# Patient Record
Sex: Male | Born: 1945 | Race: White | Hispanic: No | Marital: Married | Smoking: Former smoker
Health system: Southern US, Community
[De-identification: ages and names within clinical notes are randomized; demographics above are authoritative.]

## PROBLEM LIST (undated history)

## (undated) DIAGNOSIS — D649 Anemia, unspecified: Secondary | ICD-10-CM

## (undated) DIAGNOSIS — R51 Headache: Secondary | ICD-10-CM

## (undated) DIAGNOSIS — M1990 Unspecified osteoarthritis, unspecified site: Secondary | ICD-10-CM

## (undated) DIAGNOSIS — I10 Essential (primary) hypertension: Secondary | ICD-10-CM

## (undated) HISTORY — PX: CYST REMOVAL HAND: SHX6279

## (undated) HISTORY — PX: CYST REMOVAL NECK: SHX6281

---

## 1969-03-02 HISTORY — PX: ORIF ORBITAL FRACTURE: SHX5312

## 2000-10-08 ENCOUNTER — Encounter: Admission: RE | Admit: 2000-10-08 | Discharge: 2000-10-08 | Payer: Self-pay | Admitting: Sports Medicine

## 2000-10-19 ENCOUNTER — Encounter: Admission: RE | Admit: 2000-10-19 | Discharge: 2000-10-19 | Payer: Self-pay | Admitting: Family Medicine

## 2004-10-09 ENCOUNTER — Ambulatory Visit: Payer: Self-pay | Admitting: Unknown Physician Specialty

## 2009-03-02 DIAGNOSIS — D649 Anemia, unspecified: Secondary | ICD-10-CM

## 2009-03-02 HISTORY — DX: Anemia, unspecified: D64.9

## 2009-06-26 ENCOUNTER — Ambulatory Visit: Payer: Self-pay | Admitting: Gastroenterology

## 2009-08-26 ENCOUNTER — Ambulatory Visit: Payer: Self-pay | Admitting: Family Medicine

## 2009-08-29 ENCOUNTER — Ambulatory Visit: Payer: Self-pay | Admitting: Family Medicine

## 2009-08-30 ENCOUNTER — Ambulatory Visit: Payer: Self-pay | Admitting: Internal Medicine

## 2009-09-19 ENCOUNTER — Ambulatory Visit: Payer: Self-pay | Admitting: Internal Medicine

## 2009-09-20 LAB — PSA

## 2009-09-20 LAB — CEA: CEA: 1 ng/mL

## 2009-09-30 ENCOUNTER — Ambulatory Visit: Payer: Self-pay | Admitting: Internal Medicine

## 2009-10-31 ENCOUNTER — Ambulatory Visit: Payer: Self-pay | Admitting: Internal Medicine

## 2009-12-20 ENCOUNTER — Ambulatory Visit: Payer: Self-pay | Admitting: Internal Medicine

## 2009-12-31 ENCOUNTER — Ambulatory Visit: Payer: Self-pay | Admitting: Internal Medicine

## 2010-02-13 ENCOUNTER — Ambulatory Visit: Payer: Self-pay | Admitting: Neurology

## 2010-02-13 ENCOUNTER — Ambulatory Visit: Payer: Self-pay | Admitting: Internal Medicine

## 2010-03-02 ENCOUNTER — Ambulatory Visit: Payer: Self-pay | Admitting: Internal Medicine

## 2010-04-17 ENCOUNTER — Ambulatory Visit: Payer: Self-pay | Admitting: Internal Medicine

## 2010-05-01 ENCOUNTER — Ambulatory Visit: Payer: Self-pay | Admitting: Internal Medicine

## 2010-08-21 ENCOUNTER — Ambulatory Visit: Payer: Self-pay | Admitting: Internal Medicine

## 2010-08-31 ENCOUNTER — Ambulatory Visit: Payer: Self-pay | Admitting: Internal Medicine

## 2010-09-14 IMAGING — US ABDOMEN ULTRASOUND LIMITED
1 series · 17 of 25 positions shown · non-contrast
Comparison: none

REASON FOR EXAM: hepatosplenomegaly has tear drop cells and bone lesions
COMMENTS:

[Series 1: abdomen ultrasound limited · 17 of 53 slices shown]
[im 1/53]
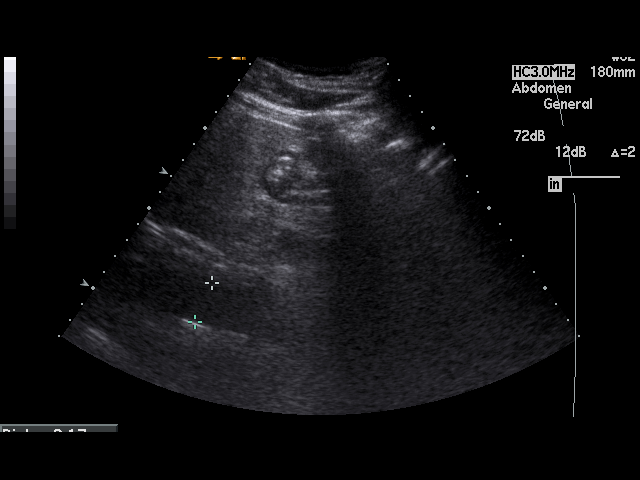
[im 5/53]
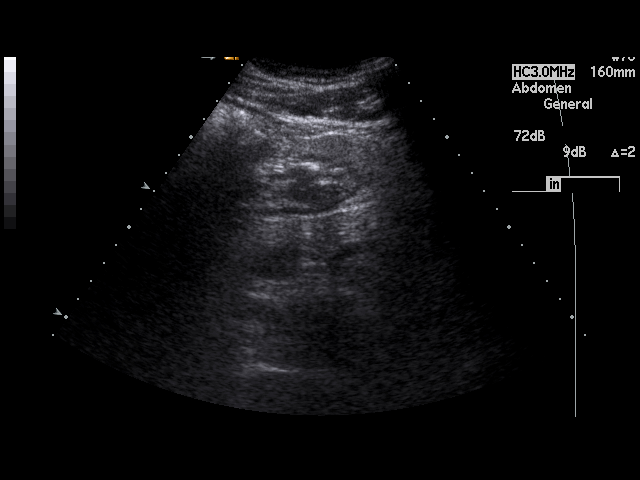
[im 7/53]
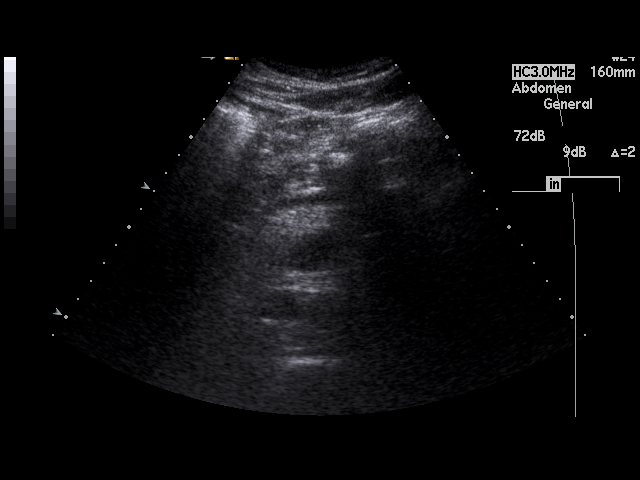
[im 11/53]
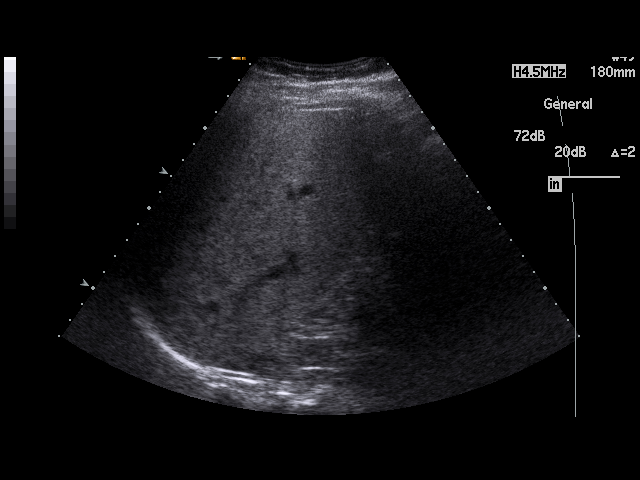
[im 14/53]
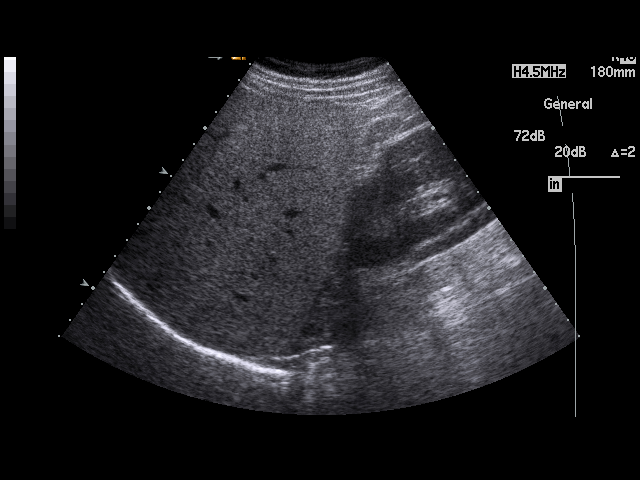
[im 18/53]
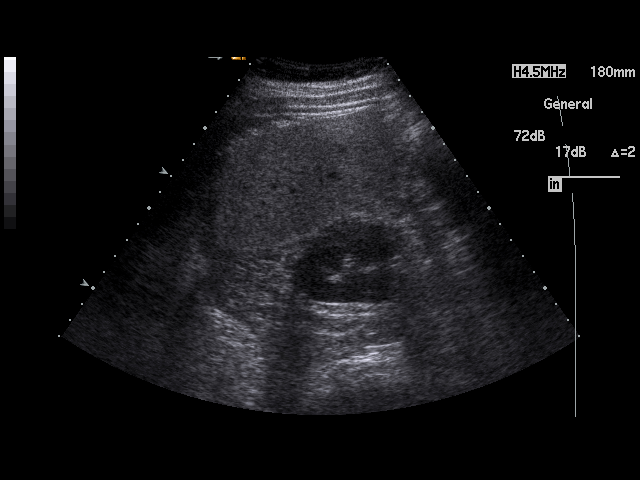
[im 20/53]
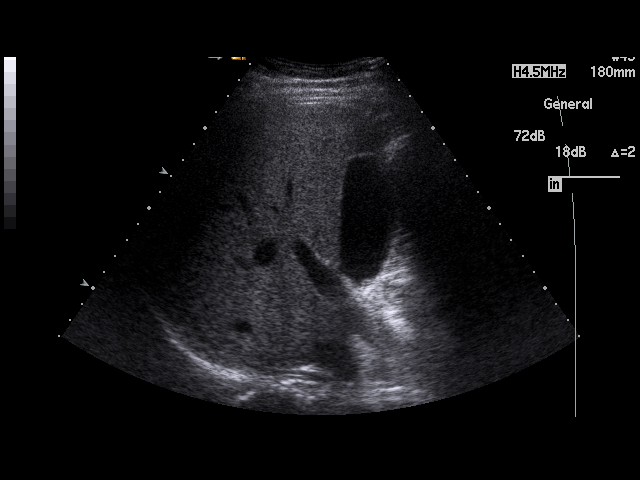
[im 24/53]
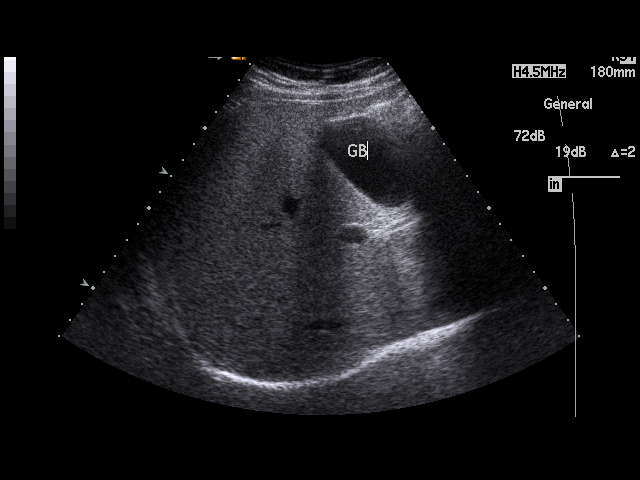
[im 27/53]
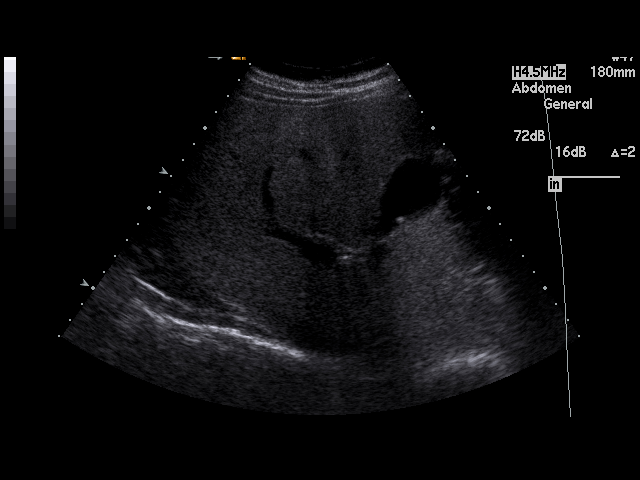
[im 29/53]
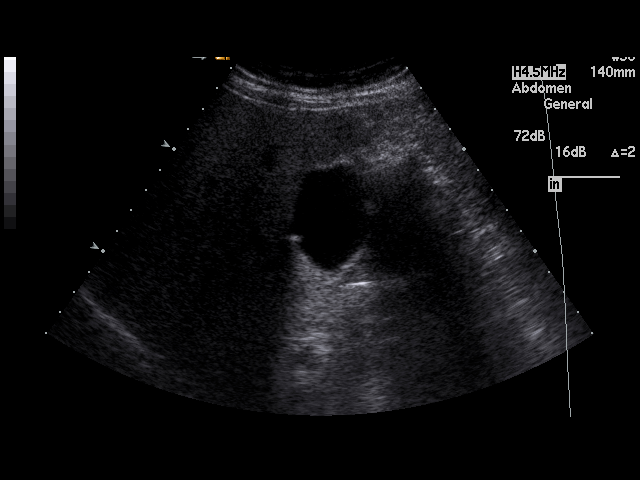
[im 33/53]
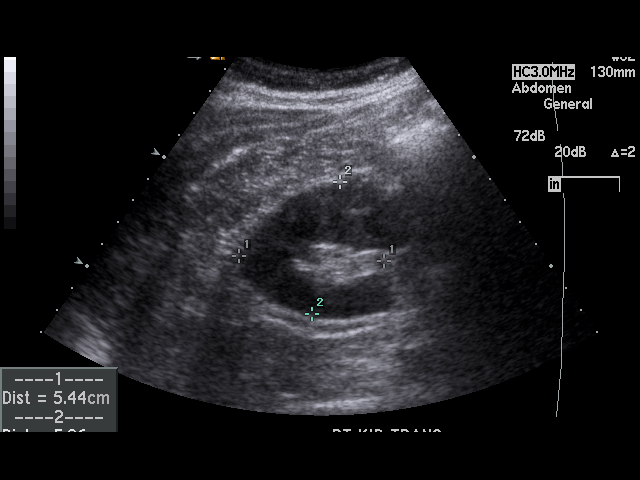
[im 35/53]
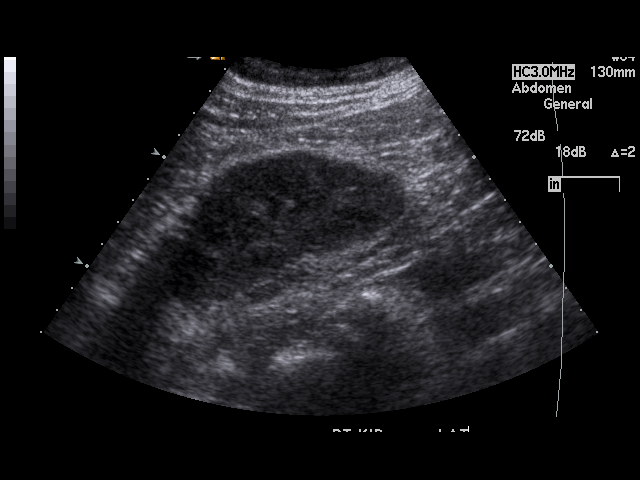
[im 40/53]
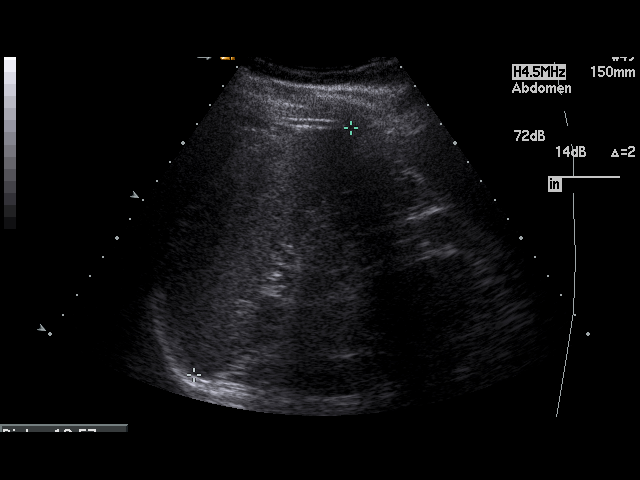
[im 42/53]
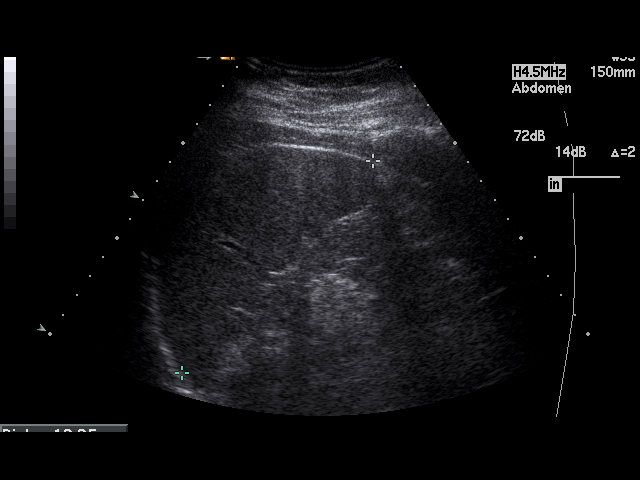
[im 46/53]
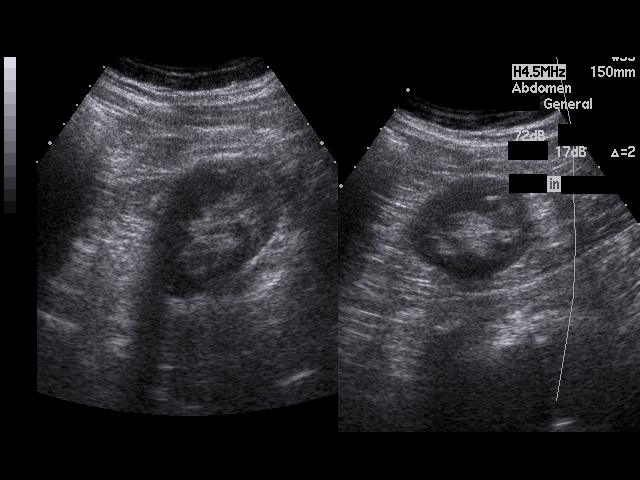
[im 48/53]
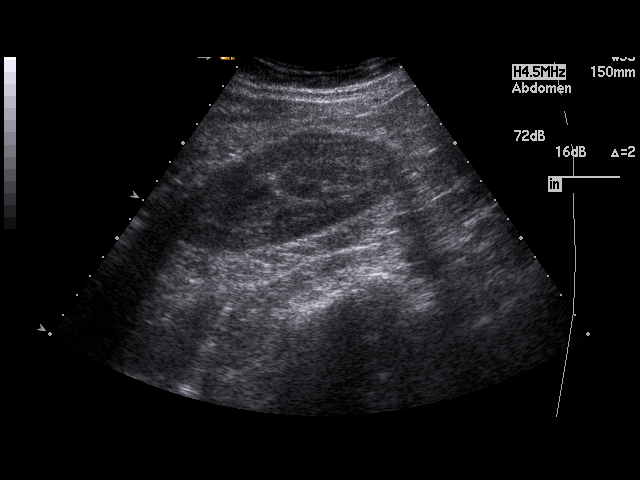
[im 53/53]
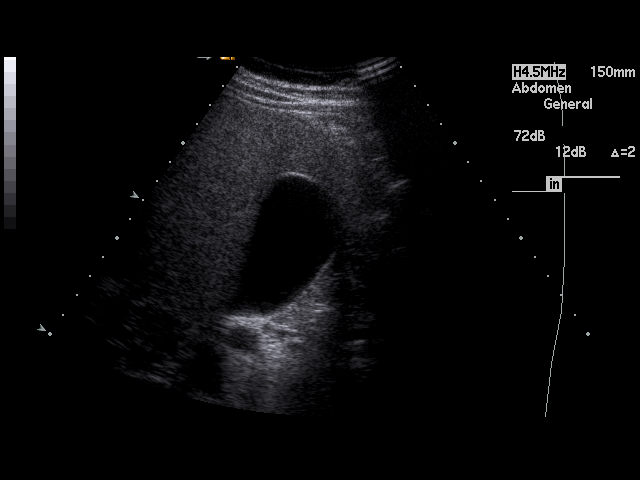

[17 of 25 positions shown; findings below may reference images not displayed]

PROCEDURE:     HLEBOVA - HLEBOVA ABDOMEN UPPER GENERAL  - October 08, 2009  [DATE]

RESULT:     Routine Abdominal Sonogram is performed. Portions of the aorta
and pancreas are poorly seen because of overlying bowel gas and the
patient's body habitus. No gross pancreatic mass is evident but a large
portion of the pancreas including the tail and portions of the body and head
are obscured. The liver demonstrates an echogenic echotexture diffusely
consistent with fatty infiltration. There is no ductal dilation or discrete
hepatic mass appreciated. The portal venous flow is normal. The common bile
duct diameter is 3.9 mm proximally. The gallbladder wall thickness is
mm. No sonographic Murphy's sign is elicited. The study demonstrates an
echogenic, nonshadowing, nonmobile structure along the wall of the
gallbladder which may represent a polyp. An adherent stone cannot be
completely excluded. There also are some linear areas of echogenicity in the
gallbladder wall which could represent cholesterolosis. The right kidney is
normal in size and echotexture. The left kidney and spleen appear
unremarkable.
IMPRESSION: Findings which could represent gallbladder polyps.
Adherent, nonshadowing stones or cholesterolosis could be considered.

## 2010-12-30 ENCOUNTER — Ambulatory Visit: Payer: Self-pay | Admitting: Internal Medicine

## 2011-01-01 ENCOUNTER — Ambulatory Visit: Payer: Self-pay | Admitting: Internal Medicine

## 2011-04-23 ENCOUNTER — Ambulatory Visit: Payer: Self-pay | Admitting: Internal Medicine

## 2011-04-23 LAB — CBC CANCER CENTER
Basophil #: 0.1 x10 3/mm (ref 0.0–0.1)
Eosinophil #: 0.1 x10 3/mm (ref 0.0–0.7)
HCT: 40.1 % (ref 40.0–52.0)
HGB: 13.7 g/dL (ref 13.0–18.0)
Lymphocyte %: 24.9 %
MCV: 90.5 fL (ref 80–100)
Monocyte #: 0.5 x10 3/mm (ref 0.0–0.7)
Monocyte %: 8.1 %
Platelet: 236 x10 3/mm (ref 150–440)
RDW: 12 % (ref 11.5–14.5)
WBC: 5.8 x10 3/mm (ref 3.8–10.6)

## 2011-04-23 LAB — HEPATIC FUNCTION PANEL A (ARMC)
Alkaline Phosphatase: 97 U/L (ref 50–136)
Bilirubin,Total: 0.5 mg/dL (ref 0.2–1.0)
SGOT(AST): 23 U/L (ref 15–37)
SGPT (ALT): 40 U/L

## 2011-04-23 LAB — CREATININE, SERUM: EGFR (African American): >60

## 2011-05-01 ENCOUNTER — Ambulatory Visit: Payer: Self-pay | Admitting: Internal Medicine

## 2011-10-21 ENCOUNTER — Ambulatory Visit: Payer: Self-pay | Admitting: Internal Medicine

## 2011-10-21 LAB — CBC CANCER CENTER
Basophil %: 1.3 %
Comment - H1-Com1: NORMAL
Comment - H1-Com2: NORMAL
Eosinophil %: 3.2 %
Eosinophil: 3 %
Lymphocyte #: 1.4 x10 3/mm (ref 1.0–3.6)
Monocyte #: 0.4 x10 3/mm (ref 0.2–1.0)
Monocyte %: 6.7 %
Neutrophil %: 62.2 %
Platelet: 218 x10 3/mm (ref 150–440)
RBC: 4.32 10*6/uL — ABNORMAL LOW (ref 4.40–5.90)
WBC: 5.4 x10 3/mm (ref 3.8–10.6)

## 2011-11-01 ENCOUNTER — Ambulatory Visit: Payer: Self-pay | Admitting: Internal Medicine

## 2012-03-02 HISTORY — PX: HERNIA REPAIR: SHX51

## 2012-04-28 ENCOUNTER — Ambulatory Visit: Payer: Self-pay | Admitting: Internal Medicine

## 2012-05-31 ENCOUNTER — Ambulatory Visit: Payer: Self-pay | Admitting: Family Medicine

## 2012-06-02 ENCOUNTER — Encounter (HOSPITAL_COMMUNITY): Payer: Self-pay | Admitting: Pharmacy Technician

## 2012-06-02 ENCOUNTER — Other Ambulatory Visit: Payer: Self-pay | Admitting: Neurosurgery

## 2012-06-03 ENCOUNTER — Encounter (HOSPITAL_COMMUNITY)
Admission: RE | Admit: 2012-06-03 | Discharge: 2012-06-03 | Disposition: A | Discharge status: Home / Self Care | Payer: Medicare Other | Source: Ambulatory Visit | Attending: Neurosurgery | Admitting: Neurosurgery

## 2012-06-03 ENCOUNTER — Ambulatory Visit (HOSPITAL_COMMUNITY)
Admission: RE | Admit: 2012-06-03 | Discharge: 2012-06-03 | Disposition: A | Discharge status: Home / Self Care | Payer: Medicare Other | Source: Ambulatory Visit | Attending: Anesthesiology | Admitting: Anesthesiology

## 2012-06-03 ENCOUNTER — Encounter (HOSPITAL_COMMUNITY): Payer: Self-pay

## 2012-06-03 DIAGNOSIS — Z87891 Personal history of nicotine dependence: Secondary | ICD-10-CM | POA: Insufficient documentation

## 2012-06-03 DIAGNOSIS — Z01818 Encounter for other preprocedural examination: Secondary | ICD-10-CM | POA: Insufficient documentation

## 2012-06-03 DIAGNOSIS — Z01812 Encounter for preprocedural laboratory examination: Secondary | ICD-10-CM | POA: Insufficient documentation

## 2012-06-03 DIAGNOSIS — Z01810 Encounter for preprocedural cardiovascular examination: Secondary | ICD-10-CM | POA: Insufficient documentation

## 2012-06-03 DIAGNOSIS — I10 Essential (primary) hypertension: Secondary | ICD-10-CM | POA: Insufficient documentation

## 2012-06-03 DIAGNOSIS — M79609 Pain in unspecified limb: Secondary | ICD-10-CM | POA: Insufficient documentation

## 2012-06-03 HISTORY — DX: Headache: R51

## 2012-06-03 HISTORY — DX: Essential (primary) hypertension: I10

## 2012-06-03 LAB — CBC
Hemoglobin: 15.3 g/dL (ref 13.0–17.0)
MCHC: 36.3 g/dL — ABNORMAL HIGH (ref 30.0–36.0)
RBC: 4.98 MIL/uL (ref 4.22–5.81)

## 2012-06-03 LAB — BASIC METABOLIC PANEL
BUN: 15 mg/dL (ref 6–23)
GFR calc Af Amer: >90 mL/min (ref >90)
GFR calc non Af Amer: >90 mL/min (ref >90)
Potassium: 3.6 mEq/L (ref 3.5–5.1)
Sodium: 138 mEq/L (ref 135–145)

## 2012-06-03 LAB — SURGICAL PCR SCREEN
MRSA, PCR: NEGATIVE
Staphylococcus aureus: NEGATIVE

## 2012-06-03 NOTE — Progress Notes (Signed)
Stop Bang score 4; faxed to PCP in [place]

## 2012-06-03 NOTE — Pre-Procedure Instructions (Signed)
Clayton Reyes  06/03/2012   Your procedure is scheduled on:  Monday, April 7  Report to [facility] at 2:00 PM.  Call this number if you have problems the morning of surgery: [phone]   Remember:   Do not eat food or drink liquids after midnight.Sunday night   Take these medicines the morning of surgery with A SIP OF WATER: gabapentin, verapamil   Do not wear jewelry, make-up or nail polish.  Do not wear lotions, powders, or perfumes. No deodorant.  Do not shave 48 hours prior to surgery. Men may shave face and neck.  Do not bring valuables to the hospital.  Contacts, dentures or bridgework may not be worn into surgery.  Leave suitcase in the car. After surgery it may be brought to your room.  For patients admitted to the hospital, checkout time is 11:00 AM the day of  discharge.      Special Instructions: Shower using CHG 2 nights before surgery and the night before surgery.  If you shower the day of surgery use CHG.  Use special wash - you have one bottle of CHG for all showers.  You should use approximately 1/3 of the bottle for each shower.   Please read over the following fact sheets that you were given: Pain Booklet, Coughing and Deep Breathing and Surgical Site Infection Prevention

## 2012-06-06 ENCOUNTER — Inpatient Hospital Stay (HOSPITAL_COMMUNITY)
Admission: RE | Admit: 2012-06-06 | Discharge: 2012-06-07 | DRG: 491 | Disposition: A | Discharge status: Home / Self Care | Payer: Medicare Other | Source: Ambulatory Visit | Attending: Neurosurgery | Admitting: Neurosurgery

## 2012-06-06 ENCOUNTER — Encounter (HOSPITAL_COMMUNITY): Payer: Self-pay | Admitting: Anesthesiology

## 2012-06-06 ENCOUNTER — Encounter (HOSPITAL_COMMUNITY): Payer: Self-pay | Admitting: Surgery

## 2012-06-06 ENCOUNTER — Inpatient Hospital Stay (HOSPITAL_COMMUNITY): Payer: Medicare Other | Admitting: Anesthesiology

## 2012-06-06 ENCOUNTER — Encounter (HOSPITAL_COMMUNITY)
Admission: RE | Disposition: A | Discharge status: Home / Self Care | Payer: Self-pay | Source: Ambulatory Visit | Attending: Neurosurgery

## 2012-06-06 ENCOUNTER — Inpatient Hospital Stay (HOSPITAL_COMMUNITY): Payer: Medicare Other

## 2012-06-06 DIAGNOSIS — M216X9 Other acquired deformities of unspecified foot: Secondary | ICD-10-CM | POA: Diagnosis present

## 2012-06-06 DIAGNOSIS — I10 Essential (primary) hypertension: Secondary | ICD-10-CM | POA: Diagnosis present

## 2012-06-06 DIAGNOSIS — M5126 Other intervertebral disc displacement, lumbar region: Principal | ICD-10-CM | POA: Diagnosis present

## 2012-06-06 DIAGNOSIS — Z87891 Personal history of nicotine dependence: Secondary | ICD-10-CM

## 2012-06-06 HISTORY — PX: LUMBAR LAMINECTOMY/DECOMPRESSION MICRODISCECTOMY: SHX5026

## 2012-06-06 SURGERY — LUMBAR LAMINECTOMY/DECOMPRESSION MICRODISCECTOMY 1 LEVEL
Anesthesia: General | Site: Back | Laterality: Right | Wound class: Clean

## 2012-06-06 MED ORDER — LACTATED RINGERS IV SOLN
INTRAVENOUS | Status: DC | PRN
  Administered 2012-06-06 (×2): via INTRAVENOUS

## 2012-06-06 MED ORDER — ACETAMINOPHEN 10 MG/ML IV SOLN
INTRAVENOUS | Status: DC | PRN
  Administered 2012-06-06: 1000 mg via INTRAVENOUS

## 2012-06-06 MED ORDER — MENTHOL 3 MG MT LOZG: 1.0000 | LOZENGE | OROMUCOSAL | Status: DC | PRN

## 2012-06-06 MED ORDER — CEFAZOLIN SODIUM-DEXTROSE 2-3 GM-% IV SOLR
INTRAVENOUS | Status: AC
  Administered 2012-06-06: 2 g via INTRAVENOUS
  Filled 2012-06-06: qty 50

## 2012-06-06 MED ORDER — LIDOCAINE-EPINEPHRINE 1 %-1:100000 IJ SOLN
INTRAMUSCULAR | Status: DC | PRN
  Administered 2012-06-06: 10 mL

## 2012-06-06 MED ORDER — HYDROCHLOROTHIAZIDE 25 MG PO TABS
25.0000 mg | ORAL_TABLET | Freq: Every day | ORAL | Status: DC
  Administered 2012-06-06 – 2012-06-07 (×2): 25 mg via ORAL
  Filled 2012-06-06 (×2): qty 1

## 2012-06-06 MED ORDER — SODIUM CHLORIDE 0.9 % IV SOLN
INTRAVENOUS | Status: AC
  Filled 2012-06-06: qty 500

## 2012-06-06 MED ORDER — LOSARTAN POTASSIUM 50 MG PO TABS
100.0000 mg | ORAL_TABLET | Freq: Every day | ORAL | Status: DC
  Administered 2012-06-06 – 2012-06-07 (×2): 100 mg via ORAL
  Filled 2012-06-06 (×2): qty 2

## 2012-06-06 MED ORDER — ROCURONIUM BROMIDE 100 MG/10ML IV SOLN
INTRAVENOUS | Status: DC | PRN
  Administered 2012-06-06: 50 mg via INTRAVENOUS
  Administered 2012-06-06: 10 mg via INTRAVENOUS

## 2012-06-06 MED ORDER — PHENOL 1.4 % MT LIQD: 1.0000 | OROMUCOSAL | Status: DC | PRN

## 2012-06-06 MED ORDER — CYCLOBENZAPRINE HCL 10 MG PO TABS: 10.0000 mg | ORAL_TABLET | Freq: Three times a day (TID) | ORAL | Status: DC | PRN

## 2012-06-06 MED ORDER — VERAPAMIL HCL ER 180 MG PO TBCR
180.0000 mg | EXTENDED_RELEASE_TABLET | Freq: Every day | ORAL | Status: DC
  Administered 2012-06-06: 180 mg via ORAL
  Filled 2012-06-06 (×2): qty 1

## 2012-06-06 MED ORDER — VERAPAMIL HCL ER 180 MG PO CP24
180.0000 mg | ORAL_CAPSULE | Freq: Every day | ORAL | Status: DC
  Filled 2012-06-06: qty 1

## 2012-06-06 MED ORDER — OXYCODONE-ACETAMINOPHEN 5-325 MG PO TABS: 1.0000 | ORAL_TABLET | ORAL | Status: DC | PRN

## 2012-06-06 MED ORDER — ACETAMINOPHEN 10 MG/ML IV SOLN
1000.0000 mg | Freq: Four times a day (QID) | INTRAVENOUS | Status: DC
  Administered 2012-06-07 (×2): 1000 mg via INTRAVENOUS
  Filled 2012-06-06 (×5): qty 100

## 2012-06-06 MED ORDER — BACITRACIN 50000 UNITS IM SOLR
INTRAMUSCULAR | Status: AC
  Filled 2012-06-06: qty 1

## 2012-06-06 MED ORDER — HYDROMORPHONE HCL PF 1 MG/ML IJ SOLN
INTRAMUSCULAR | Status: AC
  Filled 2012-06-06: qty 1

## 2012-06-06 MED ORDER — EPHEDRINE SULFATE 50 MG/ML IJ SOLN
INTRAMUSCULAR | Status: DC | PRN
  Administered 2012-06-06 (×3): 10 mg via INTRAVENOUS

## 2012-06-06 MED ORDER — ACETAMINOPHEN 650 MG RE SUPP: 650.0000 mg | RECTAL | Status: DC | PRN

## 2012-06-06 MED ORDER — MIDAZOLAM HCL 5 MG/5ML IJ SOLN
INTRAMUSCULAR | Status: DC | PRN
  Administered 2012-06-06: 2 mg via INTRAVENOUS

## 2012-06-06 MED ORDER — NEOSTIGMINE METHYLSULFATE 1 MG/ML IJ SOLN
INTRAMUSCULAR | Status: DC | PRN
  Administered 2012-06-06: 4 mg via INTRAVENOUS

## 2012-06-06 MED ORDER — GABAPENTIN 300 MG PO CAPS
300.0000 mg | ORAL_CAPSULE | Freq: Two times a day (BID) | ORAL | Status: DC
  Administered 2012-06-06 – 2012-06-07 (×2): 300 mg via ORAL
  Filled 2012-06-06 (×3): qty 1

## 2012-06-06 MED ORDER — HYDROMORPHONE HCL PF 1 MG/ML IJ SOLN
0.2500 mg | INTRAMUSCULAR | Status: DC | PRN
  Administered 2012-06-06 (×2): 0.5 mg via INTRAVENOUS

## 2012-06-06 MED ORDER — THROMBIN 5000 UNITS EX SOLR
CUTANEOUS | Status: DC | PRN
  Administered 2012-06-06 (×2): 5000 [IU] via TOPICAL

## 2012-06-06 MED ORDER — SODIUM CHLORIDE 0.9 % IJ SOLN: 3.0000 mL | INTRAMUSCULAR | Status: DC | PRN

## 2012-06-06 MED ORDER — LIDOCAINE HCL (CARDIAC) 20 MG/ML IV SOLN
INTRAVENOUS | Status: DC | PRN
  Administered 2012-06-06: 80 mg via INTRAVENOUS

## 2012-06-06 MED ORDER — SODIUM CHLORIDE 0.9 % IJ SOLN
3.0000 mL | Freq: Two times a day (BID) | INTRAMUSCULAR | Status: DC
  Administered 2012-06-06 – 2012-06-07 (×2): 3 mL via INTRAVENOUS

## 2012-06-06 MED ORDER — 0.9 % SODIUM CHLORIDE (POUR BTL) OPTIME
TOPICAL | Status: DC | PRN
  Administered 2012-06-06: 1000 mL

## 2012-06-06 MED ORDER — CEFAZOLIN SODIUM 1-5 GM-% IV SOLN
1.0000 g | Freq: Three times a day (TID) | INTRAVENOUS | Status: AC
  Administered 2012-06-06 – 2012-06-07 (×2): 1 g via INTRAVENOUS
  Filled 2012-06-06 (×2): qty 50

## 2012-06-06 MED ORDER — PROPOFOL 10 MG/ML IV BOLUS
INTRAVENOUS | Status: DC | PRN
  Administered 2012-06-06: 300 mg via INTRAVENOUS

## 2012-06-06 MED ORDER — HEMOSTATIC AGENTS (NO CHARGE) OPTIME
TOPICAL | Status: DC | PRN
  Administered 2012-06-06: 1 via TOPICAL

## 2012-06-06 MED ORDER — SODIUM CHLORIDE 0.9 % IV SOLN: 250.0000 mL | INTRAVENOUS | Status: DC

## 2012-06-06 MED ORDER — HYDROMORPHONE HCL PF 1 MG/ML IJ SOLN: 0.2500 mg | INTRAMUSCULAR | Status: DC | PRN

## 2012-06-06 MED ORDER — ONDANSETRON HCL 4 MG/2ML IJ SOLN: 4.0000 mg | Freq: Once | INTRAMUSCULAR | Status: DC | PRN

## 2012-06-06 MED ORDER — BUPIVACAINE HCL (PF) 0.25 % IJ SOLN
INTRAMUSCULAR | Status: DC | PRN
  Administered 2012-06-06: 10 mL

## 2012-06-06 MED ORDER — ACETAMINOPHEN 325 MG PO TABS: 650.0000 mg | ORAL_TABLET | ORAL | Status: DC | PRN

## 2012-06-06 MED ORDER — LOSARTAN POTASSIUM-HCTZ 100-25 MG PO TABS: 1.0000 | ORAL_TABLET | Freq: Every day | ORAL | Status: DC

## 2012-06-06 MED ORDER — ALUM & MAG HYDROXIDE-SIMETH 200-200-20 MG/5ML PO SUSP: 30.0000 mL | Freq: Four times a day (QID) | ORAL | Status: DC | PRN

## 2012-06-06 MED ORDER — DOCUSATE SODIUM 100 MG PO CAPS
100.0000 mg | ORAL_CAPSULE | Freq: Two times a day (BID) | ORAL | Status: DC
  Administered 2012-06-06 – 2012-06-07 (×2): 100 mg via ORAL
  Filled 2012-06-06 (×2): qty 1

## 2012-06-06 MED ORDER — SODIUM CHLORIDE 0.9 % IR SOLN
Status: DC | PRN
  Administered 2012-06-06: 17:00:00

## 2012-06-06 MED ORDER — GLYCOPYRROLATE 0.2 MG/ML IJ SOLN
INTRAMUSCULAR | Status: DC | PRN
  Administered 2012-06-06: 0.6 mg via INTRAVENOUS

## 2012-06-06 MED ORDER — INDOMETHACIN ER 75 MG PO CPCR
75.0000 mg | ORAL_CAPSULE | Freq: Every day | ORAL | Status: DC
  Administered 2012-06-06 – 2012-06-07 (×2): 75 mg via ORAL
  Filled 2012-06-06 (×2): qty 1

## 2012-06-06 MED ORDER — HYDROMORPHONE HCL PF 1 MG/ML IJ SOLN: 0.5000 mg | INTRAMUSCULAR | Status: DC | PRN

## 2012-06-06 MED ORDER — FENTANYL CITRATE 0.05 MG/ML IJ SOLN
INTRAMUSCULAR | Status: DC | PRN
  Administered 2012-06-06: 100 ug via INTRAVENOUS
  Administered 2012-06-06 (×3): 50 ug via INTRAVENOUS

## 2012-06-06 MED ORDER — ACETAMINOPHEN 10 MG/ML IV SOLN
INTRAVENOUS | Status: AC
  Filled 2012-06-06: qty 100

## 2012-06-06 MED ORDER — ONDANSETRON HCL 4 MG/2ML IJ SOLN: 4.0000 mg | INTRAMUSCULAR | Status: DC | PRN

## 2012-06-06 SURGICAL SUPPLY — 62 items
ADH SKN CLS APL DERMABOND .7 (GAUZE/BANDAGES/DRESSINGS) ×1
APL SKNCLS STERI-STRIP NONHPOA (GAUZE/BANDAGES/DRESSINGS) ×1
BAG DECANTER FOR FLEXI CONT (MISCELLANEOUS) ×1 IMPLANT
BENZOIN TINCTURE PRP APPL 2/3 (GAUZE/BANDAGES/DRESSINGS) ×2 IMPLANT
BLADE SURG 11 STRL SS (BLADE) ×2 IMPLANT
BLADE SURG ROTATE 9660 (MISCELLANEOUS) ×1 IMPLANT
BRUSH SCRUB EZ PLAIN DRY (MISCELLANEOUS) ×2 IMPLANT
BUR MATCHSTICK NEURO 3.0 LAGG (BURR) ×2 IMPLANT
BUR PRECISION FLUTE 6.0 (BURR) ×2 IMPLANT
CANISTER SUCTION 2500CC (MISCELLANEOUS) ×2 IMPLANT
CLOTH BEACON ORANGE TIMEOUT ST (SAFETY) ×2 IMPLANT
CONT SPEC 4OZ CLIKSEAL STRL BL (MISCELLANEOUS) ×2 IMPLANT
DECANTER SPIKE VIAL GLASS SM (MISCELLANEOUS) ×2 IMPLANT
DERMABOND ADVANCED (GAUZE/BANDAGES/DRESSINGS) ×1
DERMABOND ADVANCED .7 DNX12 (GAUZE/BANDAGES/DRESSINGS) ×1 IMPLANT
DRAPE LAPAROTOMY 100X72X124 (DRAPES) ×2 IMPLANT
DRAPE MICROSCOPE LEICA (MISCELLANEOUS) ×1 IMPLANT
DRAPE MICROSCOPE ZEISS OPMI (DRAPES) ×1 IMPLANT
DRAPE POUCH INSTRU U-SHP 10X18 (DRAPES) ×2 IMPLANT
DRAPE PROXIMA HALF (DRAPES) IMPLANT
DRAPE SURG 17X23 STRL (DRAPES) ×2 IMPLANT
DRSG OPSITE 4X5.5 SM (GAUZE/BANDAGES/DRESSINGS) ×2 IMPLANT
DURAPREP 26ML APPLICATOR (WOUND CARE) ×2 IMPLANT
ELECT REM PT RETURN 9FT ADLT (ELECTROSURGICAL) ×2
ELECTRODE REM PT RTRN 9FT ADLT (ELECTROSURGICAL) ×1 IMPLANT
GAUZE SPONGE 4X4 16PLY XRAY LF (GAUZE/BANDAGES/DRESSINGS) IMPLANT
GLOVE BIO SURGEON STRL SZ8 (GLOVE) ×2 IMPLANT
GLOVE BIO SURGEON STRL SZ8.5 (GLOVE) ×1 IMPLANT
GLOVE BIOGEL PI IND STRL 7.0 (GLOVE) IMPLANT
GLOVE BIOGEL PI IND STRL 7.5 (GLOVE) IMPLANT
GLOVE BIOGEL PI INDICATOR 7.0 (GLOVE) ×1
GLOVE BIOGEL PI INDICATOR 7.5 (GLOVE) ×1
GLOVE ECLIPSE 7.5 STRL STRAW (GLOVE) ×2 IMPLANT
GLOVE EXAM NITRILE LRG STRL (GLOVE) IMPLANT
GLOVE EXAM NITRILE MD LF STRL (GLOVE) ×1 IMPLANT
GLOVE EXAM NITRILE XL STR (GLOVE) IMPLANT
GLOVE EXAM NITRILE XS STR PU (GLOVE) IMPLANT
GLOVE INDICATOR 8.5 STRL (GLOVE) ×2 IMPLANT
GLOVE OPTIFIT SS 8.0 STRL (GLOVE) ×1 IMPLANT
GLOVE SURG SS PI 7.0 STRL IVOR (GLOVE) ×2 IMPLANT
GOWN BRE IMP SLV AUR LG STRL (GOWN DISPOSABLE) ×1 IMPLANT
GOWN BRE IMP SLV AUR XL STRL (GOWN DISPOSABLE) ×5 IMPLANT
GOWN STRL REIN 2XL LVL4 (GOWN DISPOSABLE) ×1 IMPLANT
KIT BASIN OR (CUSTOM PROCEDURE TRAY) ×2 IMPLANT
KIT ROOM TURNOVER OR (KITS) ×2 IMPLANT
NDL SPNL 22GX3.5 QUINCKE BK (NEEDLE) ×1 IMPLANT
NEEDLE HYPO 22GX1.5 SAFETY (NEEDLE) ×2 IMPLANT
NEEDLE SPNL 22GX3.5 QUINCKE BK (NEEDLE) ×2 IMPLANT
NS IRRIG 1000ML POUR BTL (IV SOLUTION) ×2 IMPLANT
PACK LAMINECTOMY NEURO (CUSTOM PROCEDURE TRAY) ×2 IMPLANT
RUBBERBAND STERILE (MISCELLANEOUS) ×4 IMPLANT
SPONGE GAUZE 4X4 12PLY (GAUZE/BANDAGES/DRESSINGS) ×2 IMPLANT
SPONGE SURGIFOAM ABS GEL SZ50 (HEMOSTASIS) ×2 IMPLANT
STRIP CLOSURE SKIN 1/2X4 (GAUZE/BANDAGES/DRESSINGS) ×2 IMPLANT
SUT VIC AB 0 CT1 18XCR BRD8 (SUTURE) ×1 IMPLANT
SUT VIC AB 0 CT1 8-18 (SUTURE) ×2
SUT VIC AB 2-0 CT1 18 (SUTURE) ×2 IMPLANT
SUT VICRYL 4-0 PS2 18IN ABS (SUTURE) ×2 IMPLANT
SYR 20ML ECCENTRIC (SYRINGE) ×2 IMPLANT
TOWEL OR 17X24 6PK STRL BLUE (TOWEL DISPOSABLE) ×2 IMPLANT
TOWEL OR 17X26 10 PK STRL BLUE (TOWEL DISPOSABLE) ×2 IMPLANT
WATER STERILE IRR 1000ML POUR (IV SOLUTION) ×2 IMPLANT

## 2012-06-06 NOTE — Op Note (Signed)
Preoperative diagnosis: Lumbar spinal stenosis L3-4 with right-sided L4 radiculopathy and footdrop and herniated nucleus pulposus L3-4  Postoperative diagnosis: Same  Procedure: Decompressive lumbar laminectomy partial medial facetectomy and foraminotomy at L3-4 with microscopic discectomy and microscopic dissection of the right L4 nerve root  Surgeon: Jillyn Hidden Maniyah Moller  Assistant: Tressie Stalker  Anesthesia: Gen.  EBL: Minimal  History of present illness: Patient is a very pleasant 67 year old gentleman is a progress worsening back and right leg pain rating down back in the back and outside of his thigh the front of his shin occasion top of his foot patient developed weakness of his right foot approximately 2 weeks ago workup has revealed severe lumbar spinal stenosis with clubbing of his nerve root a large disc herniation displacing the right L4 nerve root due to a combination of stenosis herniated nucleus pulposus and clinical exam consistent with weakness of his right foot I recommended a decompressive lumbar lipectomy and discectomy at this level of the wrist nevus of the operation with him as well as therapy course expectations are cultured surgery and should agree to proceed forward.  Operative procedure: Patient brought into the or was induced under general anesthesia positioned probable sprain his back was prepped and draped in routine sterile fashion preoperative x-ray localize the appropriate level so after infiltration of 10 cc lidocaine with epi a midline incision was made and Bovie cautery stick and subcutaneous tissue subperiosteal dissections care lamina of L3 and L4 on the right side interoperative X. identify the appropriate level. Using a high-speed drill the spinous process and virtually the entire right lamina of L3 was drilled down the medial facet was also drilled down the super aspect of L4. Using a 4 Penfield plane was developed from the ligament underneath the lamina and this  lamina was removed in piecemeal fashion with 2 and 3 mm Kerrison punch exposing ligamentum flavum which was markedly hypertrophied and this was removed in piecemeal fashion exposing the thecal sac the immediately identified was marked stenosis and hourglass compression of thecal sac from marked facet arthropathy and severe compression from the superior aspect of the L4 lamina laminotomy was carried to the super acid L3 and the undersurface of the gutter was bitten down to the level of the L4 lamina the L4 lamina was then bitten away approximately was the superior one third of it at the level of the L4 pedicle and just distal to the L4 pedicle. The L4 nerve was identified the disc space was identified the space was inspected and incised several large free fragments disc removed we removed the disc space there was also a free fragment that migrated caudally against the pedicle displacing the L4 nerve root this was also incised and suffering cyst removed this compartment as well at the end of the discectomy and decompression there is no further stenosis on thecal sac or the L4 nerve root was come see her get meticulous hemostasis was maintained Gelfoam was opened up the dura and the muscle fascia proximal in layers with after Vicryl and the skin was closed running 4 subcuticular benzoin Steri-Strips and Dermabond were applied patient recovered in stable condition. At the case all needle counts and sponge counts were correct per the nurses.

## 2012-06-06 NOTE — Anesthesia Postprocedure Evaluation (Signed)
Anesthesia Post Note  Patient: Clayton Reyes  Procedure(s) Performed: Procedure(s) (LRB): LUMBAR LAMINECTOMY/DECOMPRESSION MICRODISCECTOMY 1 LEVEL (Right)  Anesthesia type: General  Patient location: PACU  Post pain: Pain level controlled and Adequate analgesia  Post assessment: Post-op Vital signs reviewed, Patient's Cardiovascular Status Stable, Respiratory Function Stable, Patent Airway and Pain level controlled  Last Vitals:  Filed Vitals:   06/06/12 1900  BP: 131/71  Pulse:   Temp:   Resp:     Post vital signs: Reviewed and stable  Level of consciousness: awake, alert  and oriented  Complications: No apparent anesthesia complications

## 2012-06-06 NOTE — Anesthesia Procedure Notes (Signed)
Procedure Name: Intubation Date/Time: 06/06/2012 4:06 PM Performed by: Ellin Goodie Pre-anesthesia Checklist: Emergency Drugs available, Suction available, Patient being monitored, Patient identified and Timeout performed Patient Re-evaluated:Patient Re-evaluated prior to inductionOxygen Delivery Method: Circle system utilized Preoxygenation: Pre-oxygenation with 100% oxygen Intubation Type: IV induction Ventilation: Mask ventilation without difficulty Laryngoscope Size: Mac and 3 Grade View: Grade I Tube type: Oral Tube size: 7.5 mm Number of attempts: 1 Airway Equipment and Method: Stylet Placement Confirmation: ETT inserted through vocal cords under direct vision,  positive ETCO2 and breath sounds checked- equal and bilateral Secured at: 22 cm Tube secured with: Tape Dental Injury: Teeth and Oropharynx as per pre-operative assessment

## 2012-06-06 NOTE — Anesthesia Preprocedure Evaluation (Addendum)
Anesthesia Evaluation  Patient identified by MRN, date of birth, ID band Patient awake    Reviewed: Allergy & Precautions, H&P , NPO status , Patient's Chart, lab work & pertinent test results  Airway Mallampati: I TM Distance: >3 FB Neck ROM: full    Dental  (+) Teeth Intact and Dental Advisory Given   Pulmonary  breath sounds clear to auscultation        Cardiovascular hypertension, Rhythm:regular Rate:Normal     Neuro/Psych  Headaches,    GI/Hepatic   Endo/Other    Renal/GU      Musculoskeletal   Abdominal (+)  Abdomen: soft. Bowel sounds: normal.  Peds  Hematology   Anesthesia Other Findings   Reproductive/Obstetrics                          Anesthesia Physical Anesthesia Plan  ASA: II  Anesthesia Plan: General   Post-op Pain Management:    Induction: Intravenous  Airway Management Planned: Oral ETT  Additional Equipment:   Intra-op Plan:   Post-operative Plan: Extubation in OR  Informed Consent: I have reviewed the patients History and Physical, chart, labs and discussed the procedure including the risks, benefits and alternatives for the proposed anesthesia with the patient or authorized representative who has indicated his/her understanding and acceptance.     Plan Discussed with: CRNA, Anesthesiologist and Surgeon  Anesthesia Plan Comments:         Anesthesia Quick Evaluation

## 2012-06-06 NOTE — Progress Notes (Signed)
Notified Heather on Neuro OR of orders not being signed and consent needs signed in OR.

## 2012-06-06 NOTE — H&P (Signed)
Clayton Reyes is an 67 y.o. male.   Chief Complaint: Back and right leg pain HPI: 67 year old gentleman with long-standing back and right leg pain rating her sutures have been in the back of his hamstrings and calf in the front of his shin on the right side. He also has numbness tingling his same distribution this been going on for a couple months however last week she's noticed weakness in his foot drag his foot behind him and difficulty lifting his toes up. Workup by MRI scan showed severe lumbar spinal stenosis with a broad-based rightward disc herniation causing marked stenosis of the right L4 nerve root due to patient's significant weakness in his lower 70s takes her treatment and imaging findings I recommended a decompression from the right and discectomy at L3-4 I extensively reviewed the risks benefits of the operation as well as perioperative course expectations of outcome and alternatives to surgery and he understood and agreed to proceed forward.  Past Medical History  Diagnosis Date  . Hypertension   . Headache     Past Surgical History  Procedure Laterality Date  . Cyst removal hand Left   . Cyst removal neck Right   . Orif orbital fracture Left 1971    History reviewed. No pertinent family history. Social History:  reports that he has quit smoking. His smoking use included Cigarettes. He has a 20 pack-year smoking history. He has never used smokeless tobacco. He reports that  drinks alcohol. He reports that he does not use illicit drugs.  Allergies: No Known Allergies  Medications Prior to Admission  Medication Sig Dispense Refill  . gabapentin (NEURONTIN) 300 MG capsule Take 300 mg by mouth 2 (two) times daily.      . indomethacin (INDOCIN SR) 75 MG CR capsule Take 75 mg by mouth daily.      Marland Kitchen losartan-hydrochlorothiazide (HYZAAR) 100-25 MG per tablet Take 1 tablet by mouth daily.      . verapamil (VERELAN PM) 180 MG 24 hr capsule Take 180 mg by mouth at bedtime.        No  results found for this or any previous visit (from the past 48 hour(s)). No results found.  Review of Systems  Constitutional: Negative.   HENT: Negative.   Eyes: Negative.   Respiratory: Negative.   Cardiovascular: Negative.   Gastrointestinal: Negative.   Genitourinary: Negative.   Musculoskeletal: Negative.   Skin: Negative.   Neurological: Negative.   Endo/Heme/Allergies: Negative.   Psychiatric/Behavioral: Negative.     Blood pressure 144/84, pulse 66, temperature 97.7 F (36.5 C), temperature source Oral, resp. rate 20, SpO2 100.00%. Physical Exam  Constitutional: He is oriented to person, place, and time. He appears well-developed.  HENT:  Head: Normocephalic.  Eyes: Pupils are equal, round, and reactive to light.  Neck: Normal range of motion.  Cardiovascular: Normal rate.   Respiratory: Effort normal.  GI: Soft.  Musculoskeletal: Normal range of motion.  Neurological: He is alert and oriented to person, place, and time. He displays a negative Romberg sign. GCS eye subscore is 4. GCS verbal subscore is 5. GCS motor subscore is 6.  Reflex Scores:      Brachioradialis reflexes are 0 on the right side and 0 on the left side.      Patellar reflexes are 0 on the right side and 0 on the left side.      Achilles reflexes are 0 on the right side and 0 on the left side. 5 his iliopsoas, quads, hamstrings,  gastrocs, anterior tibialis, EHL. Right looks to me has significant weakness on dorsiflexion of 4 minus out of 5 in EHL at about 2-3/5 otherwise is 5 out of 5  Skin: Skin is warm.     Assessment/Plan 67 year old gentleman presents for right-sided L3-4 decompression discectomy  Tyffany Waldrop P 06/06/2012, 4:04 PM

## 2012-06-06 NOTE — Transfer of Care (Signed)
Immediate Anesthesia Transfer of Care Note  Patient: Clayton Reyes  Procedure(s) Performed: Procedure(s) with comments: LUMBAR LAMINECTOMY/DECOMPRESSION MICRODISCECTOMY 1 LEVEL (Right) - Right Lumbar Three-Four Decompressive Laminectomy and Microdiskectomy  Patient Location: PACU  Anesthesia Type:General  Level of Consciousness: awake, alert  and oriented  Airway & Oxygen Therapy: Patient connected to face mask oxygen  Post-op Assessment: Report given to PACU RN  Post vital signs: stable  Complications: No apparent anesthesia complications

## 2012-06-06 NOTE — Preoperative (Signed)
Beta Blockers   Reason not to administer Beta Blockers:Not Applicable 

## 2012-06-07 MED ORDER — OXYCODONE-ACETAMINOPHEN 5-325 MG PO TABS: 1.0000 | ORAL_TABLET | ORAL | Status: DC | PRN

## 2012-06-07 MED ORDER — CYCLOBENZAPRINE HCL 10 MG PO TABS: 10.0000 mg | ORAL_TABLET | Freq: Three times a day (TID) | ORAL | Status: DC | PRN

## 2012-06-07 NOTE — Discharge Summary (Signed)
  Physician Discharge Summary  Patient ID: Clayton Reyes MRN: [id number] DOB/AGE: April 06, 1945 67 y.o.  Admit date: 06/06/2012 Discharge date: 06/07/2012  Admission Diagnoses: Lumbar spinal stenosis L3-4 herniated nucleus pulposus on the right at L3-4  Discharge Diagnoses: Same Active Problems:   * No active hospital problems. *   Discharged Condition: good  Hospital Course: Patient is been hospital underwent the aforementioned procedure postoperatively patient did very well recovered in the floor on the floor was angling and voiding spontaneously tolerating regular diet was stable and be discharged home scheduled followup approximately 2 week  Consults: Significant Diagnostic Studies: Treatments: Decompressive lumbar laminectomy L3-4 and the right Discharge Exam: Blood pressure 120/72, pulse 68, temperature 98.7 F (37.1 C), temperature source Oral, resp. rate 18, SpO2 97.00%. Strength out of 5/5 except for the 4+ out of 5 weakness in right dorsiflexion that is improved from preop.  wound clean and dry  Disposition: Home     Medication List    TAKE these medications       cyclobenzaprine 10 MG tablet  Commonly known as:  FLEXERIL  Take 1 tablet (10 mg total) by mouth 3 (three) times daily as needed for muscle spasms.     gabapentin 300 MG capsule  Commonly known as:  NEURONTIN  Take 300 mg by mouth 2 (two) times daily.     indomethacin 75 MG CR capsule  Commonly known as:  INDOCIN SR  Take 75 mg by mouth daily.     losartan-hydrochlorothiazide 100-25 MG per tablet  Commonly known as:  HYZAAR  Take 1 tablet by mouth daily.     oxyCODONE-acetaminophen 5-325 MG per tablet  Commonly known as:  PERCOCET/ROXICET  Take 1-2 tablets by mouth every 4 (four) hours as needed.     verapamil 180 MG 24 hr capsule  Commonly known as:  VERELAN PM  Take 180 mg by mouth at bedtime.           Follow-up Information   Follow up with Petro Talent P, MD In 2 weeks.   Contact  information:   [address] [place] [address] [phone]       Signed: Nemesio Castrillon P 06/07/2012, 10:36 AM

## 2012-06-07 NOTE — Progress Notes (Signed)
Pt doing well. Pt given D/C instructions with Rx's, verbal understanding given. Pt D/c'd home via wheelchair @ 1130 per MD order. Rema Fendt, RN

## 2012-06-07 NOTE — Progress Notes (Signed)
Subjective: Patient reports he is doing well with significant improvement in the numbness and strength of his right leg and foot stable to be discharged home  Objective: Vital signs in last 24 hours: Temp:  [97 F (36.1 C)-98.7 F (37.1 C)] 98.7 F (37.1 C) (04/08 0804) Pulse Rate:  [60-80] 68 (04/08 0804) Resp:  [12-22] 18 (04/08 0804) BP: (118-145)/(67-84) 120/72 mmHg (04/08 0804) SpO2:  [97 %-100 %] 97 % (04/08 0804)  Intake/Output from previous day: 04/07 0701 - 04/08 0700 In: 1840 [P.O.:240; I.V.:1600] Out: -  Intake/Output this shift: Total I/O In: 480 [P.O.:480] Out: -   Strength 5 out of 5 in his proximal lower 70s dorsiflexion improved 4-4+ out of 5 in his right lower extremity  Lab Results: No results found for this basename: WBC, HGB, HCT, PLT,  in the last 72 hours BMET No results found for this basename: NA, K, CL, CO2, GLUCOSE, BUN, CREATININE, CALCIUM,  in the last 72 hours  Studies/Results: Dg Lumbar Spine 2-3 Views  06/06/2012  *RADIOLOGY REPORT*  Clinical Data: L3-4 disc.  LUMBAR SPINE - 2-3 VIEW  Comparison: None.  Findings: Film #1 is labeled 04:40 p.m.  A needle is directed at the posterior elements of L4.  Film #2 is labeled 04:54 p.m.  A surgical probe is directed at the posterior elements of L4.  IMPRESSION: Intraoperative localization of the posterior elements of L4.   Original Report Authenticated By: Marin Roberts, M.D.     Assessment/Plan: Discharge home  LOS: 1 day     Clayton Reyes P 06/07/2012, 10:33 AM

## 2012-06-08 ENCOUNTER — Encounter (HOSPITAL_COMMUNITY): Payer: Self-pay | Admitting: Neurosurgery

## 2013-09-07 ENCOUNTER — Ambulatory Visit: Payer: Self-pay | Admitting: Surgery

## 2013-09-07 LAB — CBC WITH DIFFERENTIAL/PLATELET
BASOS PCT: 0.8 %
Basophil #: 0.1 10*3/uL (ref 0.0–0.1)
EOS PCT: 2.1 %
Eosinophil #: 0.1 10*3/uL (ref 0.0–0.7)
HCT: 39.5 % — ABNORMAL LOW (ref 40.0–52.0)
HGB: 13.6 g/dL (ref 13.0–18.0)
LYMPHS ABS: 1.7 10*3/uL (ref 1.0–3.6)
Lymphocyte %: 24.1 %
MCH: 30.6 pg (ref 26.0–34.0)
MCHC: 34.5 g/dL (ref 32.0–36.0)
MCV: 89 fL (ref 80–100)
MONOS PCT: 6.5 %
Monocyte #: 0.5 x10 3/mm (ref 0.2–1.0)
Neutrophil #: 4.6 10*3/uL (ref 1.4–6.5)
Neutrophil %: 66.5 %
Platelet: 244 10*3/uL (ref 150–440)
RBC: 4.46 10*6/uL (ref 4.40–5.90)
RDW: 13.3 % (ref 11.5–14.5)
WBC: 7 10*3/uL (ref 3.8–10.6)

## 2013-09-07 LAB — BASIC METABOLIC PANEL
ANION GAP: 9 (ref 7–16)
BUN: 21 mg/dL — ABNORMAL HIGH (ref 7–18)
CALCIUM: 8.9 mg/dL (ref 8.5–10.1)
CO2: 27 mmol/L (ref 21–32)
Chloride: 101 mmol/L (ref 98–107)
Creatinine: 0.97 mg/dL (ref 0.60–1.30)
EGFR (African American): >60
Glucose: 111 mg/dL — ABNORMAL HIGH (ref 65–99)
Osmolality: 277 (ref 275–301)
POTASSIUM: 3.4 mmol/L — AB (ref 3.5–5.1)
SODIUM: 137 mmol/L (ref 136–145)

## 2013-09-12 ENCOUNTER — Ambulatory Visit: Payer: Self-pay | Admitting: Surgery

## 2013-09-13 LAB — PATHOLOGY REPORT

## 2013-10-08 DIAGNOSIS — G44031 Episodic paroxysmal hemicrania, intractable: Secondary | ICD-10-CM | POA: Insufficient documentation

## 2013-12-14 DIAGNOSIS — E785 Hyperlipidemia, unspecified: Secondary | ICD-10-CM | POA: Insufficient documentation

## 2014-06-23 NOTE — Op Note (Signed)
PATIENT NAME:  Clayton Reyes, Clayton Reyes MR#:  [id number] DATE OF BIRTH:  January 09, 1946  DATE OF PROCEDURE:  09/12/2013  PREOPERATIVE DIAGNOSIS: Symptomatic umbilical hernia.   POSTOPERATIVE DIAGNOSIS: Symptomatic umbilical hernia.   PROCEDURE PERFORMED: Ventral hernia repair with mesh.  SURGEON: Sherri Rad, M.D. FACS  ASSISTANT: Scrub Tech  ANESTHESIA: General with local.   FINDINGS: 1.5 cm fascial defect.   SPECIMEN: Hernia contents and sac.   DESCRIPTION OF PROCEDURE: With informed consent, supine position, sterile clip and drape of the patient's abdomen including Ioban a timeout was observed. An infraumbilical curvilinear incision was fashioned with scalpel. Umbilical stalk was detached from the anterior portion of the hernia sac with sharp dissection. The hernia sac was then excised. The fascial edges were identified. The fascial defect measured 1.5 cm in greatest dimension. A 4.3 cm circular Ventralex mesh was inserted into the preperitoneal space pocket which was formed with blunt technique and secured at 4 quadrants with #0 Vicryl suture. The fascial edges were then easily reapproximated with #0 Vicryl suture. The umbilical dermis was then reattached to the fascia with a 2-0 Vicryl suture in figure-of-eight orientation. Deep layer of the pocket was reapproximated utilizing interrupted 3-0 Vicryl sutures. Then 3-0 Vicryl sutures were placed in deep dermal inverted fashion followed by running 4-0 Vicryl subcuticular in the skin, benzoin, Steri-Strips, Telfa and Tegaderm. The patient was then subsequently extubated and taken to the recovery room in stable and satisfactory condition by anesthesia services.   ____________________________ Jeannette How Marina Gravel, MD mab:sb D: 09/12/2013 14:37:57 ET T: 09/12/2013 16:57:42 ET JOB#: [id number]  cc: Elta Guadeloupe A. Marina Gravel, MD, <Dictator> Sofie Hartigan, MD Ahlayah Tarkowski Bettina Gavia MD ELECTRONICALLY SIGNED 09/17/2013 18:46

## 2016-04-08 ENCOUNTER — Encounter: Payer: Self-pay | Admitting: *Deleted

## 2016-04-09 ENCOUNTER — Encounter
Admission: RE | Disposition: A | Discharge status: Home / Self Care | Payer: Self-pay | Source: Ambulatory Visit | Attending: Gastroenterology

## 2016-04-09 ENCOUNTER — Ambulatory Visit: Payer: Medicare Other | Admitting: Anesthesiology

## 2016-04-09 ENCOUNTER — Ambulatory Visit
Admission: RE | Admit: 2016-04-09 | Discharge: 2016-04-09 | Disposition: A | Discharge status: Home / Self Care | Payer: Medicare Other | Source: Ambulatory Visit | Attending: Gastroenterology | Admitting: Gastroenterology

## 2016-04-09 ENCOUNTER — Encounter: Payer: Self-pay | Admitting: *Deleted

## 2016-04-09 DIAGNOSIS — K5730 Diverticulosis of large intestine without perforation or abscess without bleeding: Secondary | ICD-10-CM | POA: Diagnosis not present

## 2016-04-09 DIAGNOSIS — Z87891 Personal history of nicotine dependence: Secondary | ICD-10-CM | POA: Insufficient documentation

## 2016-04-09 DIAGNOSIS — I10 Essential (primary) hypertension: Secondary | ICD-10-CM | POA: Insufficient documentation

## 2016-04-09 DIAGNOSIS — Z79899 Other long term (current) drug therapy: Secondary | ICD-10-CM | POA: Insufficient documentation

## 2016-04-09 DIAGNOSIS — K621 Rectal polyp: Secondary | ICD-10-CM | POA: Diagnosis not present

## 2016-04-09 DIAGNOSIS — D124 Benign neoplasm of descending colon: Secondary | ICD-10-CM | POA: Insufficient documentation

## 2016-04-09 DIAGNOSIS — Z86010 Personal history of colonic polyps: Secondary | ICD-10-CM | POA: Diagnosis not present

## 2016-04-09 DIAGNOSIS — Z791 Long term (current) use of non-steroidal anti-inflammatories (NSAID): Secondary | ICD-10-CM | POA: Diagnosis not present

## 2016-04-09 DIAGNOSIS — K635 Polyp of colon: Secondary | ICD-10-CM | POA: Insufficient documentation

## 2016-04-09 DIAGNOSIS — Z1211 Encounter for screening for malignant neoplasm of colon: Secondary | ICD-10-CM | POA: Diagnosis not present

## 2016-04-09 HISTORY — PX: COLONOSCOPY WITH PROPOFOL: SHX5780

## 2016-04-09 HISTORY — DX: Anemia, unspecified: D64.9

## 2016-04-09 SURGERY — COLONOSCOPY WITH PROPOFOL
Anesthesia: General

## 2016-04-09 MED ORDER — SODIUM CHLORIDE 0.9 % IV SOLN
INTRAVENOUS | Status: DC
  Administered 2016-04-09: 10:00:00 via INTRAVENOUS

## 2016-04-09 MED ORDER — FENTANYL CITRATE (PF) 100 MCG/2ML IJ SOLN
INTRAMUSCULAR | Status: AC
  Filled 2016-04-09: qty 2

## 2016-04-09 MED ORDER — LACTATED RINGERS IV SOLN
INTRAVENOUS | Status: DC | PRN
  Administered 2016-04-09: 10:00:00 via INTRAVENOUS

## 2016-04-09 MED ORDER — SODIUM CHLORIDE 0.9 % IV SOLN: INTRAVENOUS | Status: DC

## 2016-04-09 MED ORDER — MIDAZOLAM HCL 2 MG/2ML IJ SOLN
INTRAMUSCULAR | Status: AC
  Filled 2016-04-09: qty 2

## 2016-04-09 MED ORDER — PROPOFOL 500 MG/50ML IV EMUL
INTRAVENOUS | Status: DC | PRN
  Administered 2016-04-09: 120 ug/kg/min via INTRAVENOUS

## 2016-04-09 MED ORDER — PROPOFOL 10 MG/ML IV BOLUS
INTRAVENOUS | Status: DC | PRN
  Administered 2016-04-09: 750 mg via INTRAVENOUS

## 2016-04-09 MED ORDER — PROPOFOL 500 MG/50ML IV EMUL
INTRAVENOUS | Status: AC
  Filled 2016-04-09: qty 50

## 2016-04-09 MED ORDER — MIDAZOLAM HCL 5 MG/5ML IJ SOLN
INTRAMUSCULAR | Status: DC | PRN
  Administered 2016-04-09: 1 mg via INTRAVENOUS

## 2016-04-09 MED ORDER — LIDOCAINE HCL (PF) 2 % IJ SOLN
INTRAMUSCULAR | Status: AC
  Filled 2016-04-09: qty 2

## 2016-04-09 MED ORDER — FENTANYL CITRATE (PF) 100 MCG/2ML IJ SOLN
INTRAMUSCULAR | Status: DC | PRN
  Administered 2016-04-09: 50 ug via INTRAVENOUS

## 2016-04-09 NOTE — Anesthesia Postprocedure Evaluation (Signed)
Anesthesia Post Note  Patient: Clayton Reyes  Procedure(s) Performed: Procedure(s) (LRB): COLONOSCOPY WITH PROPOFOL (N/A)  Patient location during evaluation: Endoscopy Anesthesia Type: General Level of consciousness: awake and alert Pain management: pain level controlled Vital Signs Assessment: post-procedure vital signs reviewed and stable Respiratory status: spontaneous breathing and respiratory function stable Cardiovascular status: stable Anesthetic complications: no     Last Vitals:  Vitals:   04/09/16 0904 04/09/16 1058  BP: 133/77 110/74  Pulse: 67 76  Resp: 18 14  Temp: (!) 36.1 C (!) 35.6 C    Last Pain:  Vitals:   04/09/16 1058  TempSrc: Tympanic                 Clayton Reyes,Clayton Reyes

## 2016-04-09 NOTE — Transfer of Care (Signed)
Immediate Anesthesia Transfer of Care Note  Patient: Clayton Reyes  Procedure(s) Performed: Procedure(s): COLONOSCOPY WITH PROPOFOL (N/A)  Patient Location: PACU and Endoscopy Unit  Anesthesia Type:General  Level of Consciousness: sedated  Airway & Oxygen Therapy: Patient Spontanous Breathing and Patient connected to nasal cannula oxygen  Post-op Assessment: Report given to RN and Post -op Vital signs reviewed and stable  Post vital signs: stable  Last Vitals:  Vitals:   04/09/16 0904  BP: 133/77  Pulse: 67  Resp: 18  Temp: (!) 36.1 C    Last Pain:  Vitals:   04/09/16 0904  TempSrc: Tympanic         Complications: No apparent anesthesia complications

## 2016-04-09 NOTE — Anesthesia Preprocedure Evaluation (Signed)
Anesthesia Evaluation  Patient identified by MRN, date of birth, ID band Patient awake    Reviewed: Allergy & Precautions, NPO status , Patient's Chart, lab work & pertinent test results  History of Anesthesia Complications Negative for: history of anesthetic complications  Airway Mallampati: II       Dental   Pulmonary neg pulmonary ROS, former smoker,           Cardiovascular hypertension, Pt. on medications      Neuro/Psych negative neurological ROS     GI/Hepatic negative GI ROS, Neg liver ROS,   Endo/Other  negative endocrine ROS  Renal/GU negative Renal ROS     Musculoskeletal   Abdominal   Peds  Hematology  (+) anemia ,   Anesthesia Other Findings   Reproductive/Obstetrics                            Anesthesia Physical Anesthesia Plan  ASA: II  Anesthesia Plan: General   Post-op Pain Management:    Induction: Intravenous  Airway Management Planned: Nasal Cannula  Additional Equipment:   Intra-op Plan:   Post-operative Plan:   Informed Consent: I have reviewed the patients History and Physical, chart, labs and discussed the procedure including the risks, benefits and alternatives for the proposed anesthesia with the patient or authorized representative who has indicated his/her understanding and acceptance.     Plan Discussed with:   Anesthesia Plan Comments:         Anesthesia Quick Evaluation

## 2016-04-09 NOTE — Op Note (Signed)
[facility] Gastroenterology Patient Name: Clayton Reyes Procedure Date: 04/09/2016 10:06 AM MRN: [id number] Account #: [id number] Date of Birth: 08-05-1945 Admit Type: Outpatient Age: 71 Room: [facility] ENDO ROOM 1 Gender: Male Note Status: Finalized Procedure:            Colonoscopy Indications:          Personal history of colonic polyps Providers:            Lollie Sails, MD Referring MD:         Sofie Hartigan (Referring MD) Medicines:            Monitored Anesthesia Care Complications:        No immediate complications. Procedure:            Pre-Anesthesia Assessment:                       - ASA Grade Assessment: II - A patient with mild                        systemic disease.                       After obtaining informed consent, the colonoscope was                        passed under direct vision. Throughout the procedure,                        the patient's blood pressure, pulse, and oxygen                        saturations were monitored continuously. The                        Colonoscope was introduced through the anus and                        advanced to the the cecum, identified by appendiceal                        orifice and ileocecal valve. The colonoscopy was                        performed without difficulty. The patient tolerated the                        procedure well. Findings:      A 7 mm polyp was found in the proximal descending colon. The polyp was       sessile. The polyp was removed with a cold snare. Resection and       retrieval were complete.      A 1 mm polyp was found in the distal sigmoid colon. The polyp was       sessile. The polyp was removed with a cold biopsy forceps. Resection and       retrieval were complete.      A 3 mm polyp was found in the rectum. The polyp was sessile. The polyp       was removed with a cold snare. Resection and retrieval were complete.      Multiple small-mouthed diverticula were  found in the sigmoid  colon and       descending colon.      The retroflexed view of the distal rectum and anal verge was normal and       showed no anal or rectal abnormalities.      The digital rectal exam was normal. Impression:           - One 7 mm polyp in the proximal descending colon,                        removed with a cold snare. Resected and retrieved.                       - One 1 mm polyp in the distal sigmoid colon, removed                        with a cold biopsy forceps. Resected and retrieved.                       - One 3 mm polyp in the rectum, removed with a cold                        snare. Resected and retrieved.                       - Diverticulosis in the sigmoid colon and in the                        descending colon.                       - The distal rectum and anal verge are normal on                        retroflexion view. Recommendation:       - Discharge patient to home.                       - Await pathology results.                       - Telephone GI clinic for pathology results in 1 week. Procedure Code(s):    --- Professional ---                       [phone], Colonoscopy, flexible; with removal of tumor(s),                        polyp(s), or other lesion(s) by snare technique                       [id number], 18, Colonoscopy, flexible; with biopsy, single                        or multiple Diagnosis Code(s):    --- Professional ---                       D12.4, Benign neoplasm of descending colon                       D12.5, Benign neoplasm of sigmoid colon  K62.1, Rectal polyp                       Z86.010, Personal history of colonic polyps                       K57.30, Diverticulosis of large intestine without                        perforation or abscess without bleeding CPT copyright 2016 American Medical Association. All rights reserved. The codes documented in this report are preliminary and upon coder review may  be  revised to meet current compliance requirements. Lollie Sails, MD 04/09/2016 10:56:13 AM This report has been signed electronically. Number of Addenda: 0 Note Initiated On: 04/09/2016 10:06 AM Scope Withdrawal Time: 0 hours 21 minutes 41 seconds  Total Procedure Duration: 0 hours 30 minutes 6 seconds       [facility]

## 2016-04-09 NOTE — H&P (Signed)
Outpatient short stay form Pre-procedure 04/09/2016 10:07 AM Lollie Sails MD  Primary Physician: Dr. Thereasa Distance  Reason for visit:  Colonoscopy  History of present illness:  Patient is a 71 year old male presenting today as above. Is a personal history of adenomatous colon polyps his last procedure being done in April 2011. He tolerated his prep well. He takes no aspirin products or blood thinning agents    Current Facility-Administered Medications:  .  0.9 %  sodium chloride infusion, , Intravenous, Continuous, Lollie Sails, MD, Last Rate: 20 mL/hr at 04/09/16 0934 .  0.9 %  sodium chloride infusion, , Intravenous, Continuous, Lollie Sails, MD  Prescriptions Prior to Admission  Medication Sig Dispense Refill Last Dose  . gabapentin (NEURONTIN) 300 MG capsule Take 300 mg by mouth 2 (two) times daily.   04/08/2016 at Unknown time  . indomethacin (INDOCIN SR) 75 MG CR capsule Take 75 mg by mouth daily.   Past Week at Unknown time  . losartan-hydrochlorothiazide (HYZAAR) 100-25 MG per tablet Take 1 tablet by mouth daily.   04/08/2016 at Unknown time  . meloxicam (MOBIC) 7.5 MG tablet Take 7.5 mg by mouth daily.   Past Week at Unknown time  . Omega-3 Fatty Acids (FISH OIL) 1000 MG CAPS Take 1,000 mg by mouth daily.   Past Week at Unknown time  . verapamil (VERELAN PM) 180 MG 24 hr capsule Take 180 mg by mouth at bedtime.   04/08/2016 at 2100  . cyclobenzaprine (FLEXERIL) 10 MG tablet Take 1 tablet (10 mg total) by mouth 3 (three) times daily as needed for muscle spasms. 80 tablet 1   . oxyCODONE-acetaminophen (PERCOCET/ROXICET) 5-325 MG per tablet Take 1-2 tablets by mouth every 4 (four) hours as needed. 80 tablet 0      No Known Allergies   Past Medical History:  Diagnosis Date  . Anemia   . Headache(784.0)   . Hypertension     Review of systems:      Physical Exam    Heart and lungs: Regular rate and rhythm without rub or gallop lungs are bilaterally clear   HEENT: Normocephalic atraumatic eyes are anicteric    Other:     Pertinant exam for procedure: Soft nontender nondistended bowel sounds positive normoactive.    Planned proceedures: Colonoscopy and indicated procedures. I have discussed the risks benefits and complications of procedures to include not limited to bleeding, infection, perforation and the risk of sedation and the patient wishes to proceed.    Lollie Sails, MD Gastroenterology 04/09/2016  10:07 AM

## 2016-04-09 NOTE — Anesthesia Post-op Follow-up Note (Signed)
Anesthesia QCDR form completed.        

## 2016-04-10 ENCOUNTER — Encounter: Payer: Self-pay | Admitting: Gastroenterology

## 2016-04-10 LAB — SURGICAL PATHOLOGY

## 2016-10-28 ENCOUNTER — Ambulatory Visit
Admission: EM | Admit: 2016-10-28 | Discharge: 2016-10-28 | Disposition: A | Discharge status: Home / Self Care | Payer: Medicare Other | Attending: Emergency Medicine | Admitting: Emergency Medicine

## 2016-10-28 ENCOUNTER — Encounter: Payer: Self-pay | Admitting: Emergency Medicine

## 2016-10-28 DIAGNOSIS — K4090 Unilateral inguinal hernia, without obstruction or gangrene, not specified as recurrent: Secondary | ICD-10-CM

## 2016-10-28 NOTE — ED Provider Notes (Signed)
HPI  SUBJECTIVE:  Clayton Reyes is a 71 y.o. male who presents with a mildly painful swelling in his groin that reduces when he presses in on it starting several hours ago. He describes the pain as mild, dull, constant. He tried applying pressure with improvement in his symptoms, no aggravating factors. He denies nausea, and, fevers, anorexia. Had a normal bowel movement earlier today. No distention, urinary complaints, penile rash, discharge, pain, scrotal or testicular swelling, tenderness, pain. States the car ride over here was okay. He has not been doing any heavy lifting. He has never had symptoms like this before. He is status post umbilical hernia repair, spine surgery for a bulging disc. Also history of hypertension. No history of diabetes, constipation, other abdominal surgeries. PMD: Dr. Ellison Hughs    Past Medical History:  Diagnosis Date  . Anemia   . Headache(784.0)   . Hypertension     Past Surgical History:  Procedure Laterality Date  . COLONOSCOPY WITH PROPOFOL N/A 04/09/2016   Procedure: COLONOSCOPY WITH PROPOFOL;  Surgeon: Lollie Sails, MD;  Location: [facility] ENDOSCOPY;  Service: Endoscopy;  Laterality: N/A;  . CYST REMOVAL HAND Left   . CYST REMOVAL NECK Right   . HERNIA REPAIR    . LUMBAR LAMINECTOMY/DECOMPRESSION MICRODISCECTOMY Right 06/06/2012   Procedure: LUMBAR LAMINECTOMY/DECOMPRESSION MICRODISCECTOMY 1 LEVEL;  Surgeon: Elaina Hoops, MD;  Location: [facility] NEURO ORS;  Service: Neurosurgery;  Laterality: Right;  Right Lumbar Three-Four Decompressive Laminectomy and Microdiskectomy  . ORIF ORBITAL FRACTURE Left 1971    History reviewed. No pertinent family history.  Social History  Substance Use Topics  . Smoking status: Former Smoker    Packs/day: 1.00    Years: 20.00    Types: Cigarettes  . Smokeless tobacco: Never Used  . Alcohol use Yes     Comment: socially    No current facility-administered medications for this encounter.   Current Outpatient  Prescriptions:  .  cyclobenzaprine (FLEXERIL) 10 MG tablet, Take 1 tablet (10 mg total) by mouth 3 (three) times daily as needed for muscle spasms., Disp: 80 tablet, Rfl: 1 .  gabapentin (NEURONTIN) 300 MG capsule, Take 600 mg by mouth 3 (three) times daily. , Disp: , Rfl:  .  indomethacin (INDOCIN SR) 75 MG CR capsule, Take 75 mg by mouth daily., Disp: , Rfl:  .  losartan-hydrochlorothiazide (HYZAAR) 100-25 MG per tablet, Take 1 tablet by mouth daily., Disp: , Rfl:  .  meloxicam (MOBIC) 7.5 MG tablet, Take 15 mg by mouth daily. , Disp: , Rfl:  .  Omega-3 Fatty Acids (FISH OIL) 1000 MG CAPS, Take 1,000 mg by mouth daily., Disp: , Rfl:  .  oxyCODONE-acetaminophen (PERCOCET/ROXICET) 5-325 MG per tablet, Take 1-2 tablets by mouth every 4 (four) hours as needed., Disp: 80 tablet, Rfl: 0 .  verapamil (VERELAN PM) 180 MG 24 hr capsule, Take 180 mg by mouth at bedtime., Disp: , Rfl:   No Known Allergies   ROS  As noted in HPI.   Physical Exam  BP (!) 141/81 (BP Location: Left Arm)   Pulse 73   Temp 98.3 F (36.8 C) (Oral)   Resp 16   Ht 5\' 8"  (1.727 m)   Wt 200 lb (90.7 kg)   SpO2 98%   BMI 30.41 kg/m   Constitutional: Well developed, well nourished, no acute distress Eyes:  EOMI, conjunctiva normal bilaterally HENT: Normocephalic, atraumatic,mucus membranes moist Respiratory: Normal inspiratory effort Cardiovascular: Normal rate GI: nondistended soft, nontender throughout all quadrants. Positive  reducible mildly tender right-sided inguinal hernia. GU: Normal circumcised male, testes descended bilaterally. No epididymal, testicular tenderness. No scrotal swelling. No penile rash, discharge. Patient declined chaperone Lymph: No inguinal lymphadenopathy skin: No rash, skin intact Musculoskeletal: no deformities Neurologic: Alert & oriented x 3, no focal neuro deficits Psychiatric: Speech and behavior appropriate   ED Course   Medications - No data to display  No orders of the  defined types were placed in this encounter.   No results found for this or any previous visit (from the past 24 hour(s)). No results found.  ED Clinical Impression  Unilateral inguinal hernia without obstruction or gangrene, recurrence not specified   ED Assessment/Plan Patient with a reducible inguinal right-sided hernia. It is mildly tender, but doubt strangulation. He otherwise appears well and is in a minimal amount of pain. Advised stool softeners as needed, plenty of extra fluids, do not strain, do not lift heavy objects. Referring to [facility] for elective repair, but gave patient strict return precautions.  Discussed MDM, plan and followup with patient. Discussed sn/sx that should prompt return to the ED. Patient agrees with plan.   No orders of the defined types were placed in this encounter.   *This clinic note was created using Dragon dictation software. Therefore, there may be occasional mistakes despite careful proofreading.  ?   Melynda Ripple, MD 10/28/16 [phone]

## 2016-10-28 NOTE — Discharge Instructions (Signed)
Do not lift anything heavy. Try not to strain when having a bowel movement. Take stool softeners if necessary. you may want to try a hernia belt. Go immediately to the ER if you cannot reduce the hernia, if it becomes very painful, if you start having nausea, vomiting, fevers, or other concerns.

## 2016-10-28 NOTE — ED Triage Notes (Signed)
Patient c/o right sided abdominal pain and swelling in the area that started this afternoon.  Patient denies N/V/D.

## 2016-10-29 ENCOUNTER — Ambulatory Visit (INDEPENDENT_AMBULATORY_CARE_PROVIDER_SITE_OTHER): Payer: Medicare Other | Admitting: Surgery

## 2016-10-29 ENCOUNTER — Encounter: Payer: Self-pay | Admitting: Surgery

## 2016-10-29 VITALS — BP 165/78 | HR 81 | Temp 98.3°F | Ht 68.0 in | Wt 202.6 lb

## 2016-10-29 DIAGNOSIS — K4090 Unilateral inguinal hernia, without obstruction or gangrene, not specified as recurrent: Secondary | ICD-10-CM

## 2016-10-29 DIAGNOSIS — D649 Anemia, unspecified: Secondary | ICD-10-CM | POA: Insufficient documentation

## 2016-10-29 DIAGNOSIS — I10 Essential (primary) hypertension: Secondary | ICD-10-CM | POA: Insufficient documentation

## 2016-10-29 NOTE — Patient Instructions (Signed)
You have chose to have your hernia repaired. This will be done by Dr. Dahlia Byes on 12/17/16 at [facility].  Please see your (blue) Pre-care information that you have been given today.  You will need to arrange to be out of work for 2 weeks and then return with a lifting restrictions for 4 more weeks. Please send any FMLA paperwork prior to surgery and we will fill this out and fax it back to your employer within 3 business days.  You may have a bruise in your groin and also swelling and brusing in your testicle area. You may use ice 4-5 times daily for 15-20 minutes each time. Make sure that you place a barrier between you and the ice pack. To decrease the swelling, you may roll up a bath towel and place it vertically in between your thighs with your testicles resting on the towel. You will want to keep this area elevated as much as possible for several days following surgery.    Inguinal Hernia, Adult Muscles help keep everything in the body in its proper place. But if a weak spot in the muscles develops, something can poke through. That is called a hernia. When this happens in the lower part of the belly (abdomen), it is called an inguinal hernia. (It takes its name from a part of the body in this region called the inguinal canal.) A weak spot in the wall of muscles lets some fat or part of the small intestine bulge through. An inguinal hernia can develop at any age. Men get them more often than women. CAUSES  In adults, an inguinal hernia develops over time.  It can be triggered by:  Suddenly straining the muscles of the lower abdomen.  Lifting heavy objects.  Straining to have a bowel movement. Difficult bowel movements (constipation) can lead to this.  Constant coughing. This may be caused by smoking or lung disease.  Being overweight.  Being pregnant.  Working at a job that requires long periods of standing or heavy lifting.  Having had an inguinal hernia before. One type can be an  emergency situation. It is called a strangulated inguinal hernia. It develops if part of the small intestine slips through the weak spot and cannot get back into the abdomen. The blood supply can be cut off. If that happens, part of the intestine may die. This situation requires emergency surgery. SYMPTOMS  Often, a small inguinal hernia has no symptoms. It is found when a healthcare provider does a physical exam. Larger hernias usually have symptoms.   In adults, symptoms may include:  A lump in the groin. This is easier to see when the person is standing. It might disappear when lying down.  In men, a lump in the scrotum.  Pain or burning in the groin. This occurs especially when lifting, straining or coughing.  A dull ache or feeling of pressure in the groin.  Signs of a strangulated hernia can include:  A bulge in the groin that becomes very painful and tender to the touch.  A bulge that turns red or purple.  Fever, nausea and vomiting.  Inability to have a bowel movement or to pass gas. DIAGNOSIS  To decide if you have an inguinal hernia, a healthcare provider will probably do a physical examination.  This will include asking questions about any symptoms you have noticed.  The healthcare provider might feel the groin area and ask you to cough. If an inguinal hernia is felt, the healthcare provider  may try to slide it back into the abdomen.  Usually no other tests are needed. TREATMENT  Treatments can vary. The size of the hernia makes a difference. Options include:  Watchful waiting. This is often suggested if the hernia is small and you have had no symptoms.  No medical procedure will be done unless symptoms develop.  You will need to watch closely for symptoms. If any occur, contact your healthcare provider right away.  Surgery. This is used if the hernia is larger or you have symptoms.  Open surgery. This is usually an outpatient procedure (you will not stay  overnight in a hospital). An cut (incision) is made through the skin in the groin. The hernia is put back inside the abdomen. The weak area in the muscles is then repaired by herniorrhaphy or hernioplasty. Herniorrhaphy: in this type of surgery, the weak muscles are sewn back together. Hernioplasty: a patch or mesh is used to close the weak area in the abdominal wall.  Laparoscopy. In this procedure, a surgeon makes small incisions. A thin tube with a tiny video camera (called a laparoscope) is put into the abdomen. The surgeon repairs the hernia with mesh by looking with the video camera and using two long instruments. HOME CARE INSTRUCTIONS   After surgery to repair an inguinal hernia:  You will need to take pain medicine prescribed by your healthcare provider. Follow all directions carefully.  You will need to take care of the wound from the incision.  Your activity will be restricted for awhile. This will probably include no heavy lifting for several weeks. You also should not do anything too active for a few weeks. When you can return to work will depend on the type of job that you have.  During "watchful waiting" periods, you should:  Maintain a healthy weight.  Eat a diet high in fiber (fruits, vegetables and whole grains).  Drink plenty of fluids to avoid constipation. This means drinking enough water and other liquids to keep your urine clear or pale yellow.  Do not lift heavy objects.  Do not stand for long periods of time.  Quit smoking. This should keep you from developing a frequent cough. SEEK MEDICAL CARE IF:   A bulge develops in your groin area.  You feel pain, a burning sensation or pressure in the groin. This might be worse if you are lifting or straining.  You develop a fever of more than 100.5 F (38.1 C). SEEK IMMEDIATE MEDICAL CARE IF:   Pain in the groin increases suddenly.  A bulge in the groin gets bigger suddenly and does not go down.  For men, there  is sudden pain in the scrotum. Or, the size of the scrotum increases.  A bulge in the groin area becomes red or purple and is painful to touch.  You have nausea or vomiting that does not go away.  You feel your heart beating much faster than normal.  You cannot have a bowel movement or pass gas.  You develop a fever of more than 102.0 F (38.9 C).   This information is not intended to replace advice given to you by your health care provider. Make sure you discuss any questions you have with your health care provider.   Document Released: 07/05/2008 Document Revised: 05/11/2011 Document Reviewed: 08/20/2014 Elsevier Interactive Patient Education [employer].

## 2016-10-30 ENCOUNTER — Telehealth: Payer: Self-pay | Admitting: General Practice

## 2016-10-30 NOTE — Progress Notes (Signed)
Patient ID: Clayton Reyes, male   DOB: 04-17-1945, 71 y.o.   MRN: [id number]  HPI Clayton Reyes is a 71 y.o. male seen in consultation at the request of Dr Clayton Reyes. He reports a new onset of right inguinal pain that is started for about 24 hours ago. Pain is mild to moderate intensity. Sharp intermittent and was shaved with certain movements of his thigh. No fevers no chills no evidence of bowel obstruction or incarcerations. She he has never had any inguinal hernia surgeries before. He did have an umbilical hernia repair with mesh several years ago. HE is Able to perform more than 4 Mets of activity without any shortness of breath or chest pain.  HPI  Past Medical History:  Diagnosis Date  . Anemia   . Headache(784.0)   . Hypertension     Past Surgical History:  Procedure Laterality Date  . COLONOSCOPY WITH PROPOFOL N/A 04/09/2016   Procedure: COLONOSCOPY WITH PROPOFOL;  Surgeon: Clayton Sails, MD;  Location[facility] ENDOSCOPY;  Service: Endoscopy;  Laterality: N/A;  . CYST REMOVAL HAND Left   . CYST REMOVAL NECK Right   . HERNIA REPAIR    . LUMBAR LAMINECTOMY/DECOMPRESSION MICRODISCECTOMY Right 06/06/2012   Procedure: LUMBAR LAMINECTOMY/DECOMPRESSION MICRODISCECTOMY 1 LEVEL;  Surgeon: Clayton Hoops, MD;  Location: [place] NEURO ORS;  Service: Neurosurgery;  Laterality: Right;  Right Lumbar Three-Four Decompressive Laminectomy and Microdiskectomy  . ORIF ORBITAL FRACTURE Left 1971    Family History  Problem Relation Age of Onset  . Diabetes Mother   . Stroke Mother   . Parkinson's disease Father   . Heart disease Father   . Stroke Sister   . Diabetes Sister     Social History Social History  Substance Use Topics  . Smoking status: Former Smoker    Packs/day: 1.00    Years: 20.00    Types: Cigarettes  . Smokeless tobacco: Never Used  . Alcohol use Yes     Comment: socially    No Known Allergies  Current Outpatient Prescriptions  Medication Sig Dispense Refill  .  gabapentin (NEURONTIN) 300 MG capsule Take 600 mg by mouth 3 (three) times daily.     Marland Kitchen losartan-hydrochlorothiazide (HYZAAR) 100-25 MG per tablet Take 1 tablet by mouth daily.    . meloxicam (MOBIC) 7.5 MG tablet Take 15 mg by mouth daily.     . Omega-3 Fatty Acids (FISH OIL) 1000 MG CAPS Take 1,000 mg by mouth daily.    . verapamil (VERELAN PM) 180 MG 24 hr capsule Take 180 mg by mouth at bedtime.     No current facility-administered medications for this visit.      Review of Systems Full ROS  was asked and was negative except for the information on the HPI  Physical Exam Blood pressure (!) 165/78, pulse 81, temperature 98.3 F (36.8 C), temperature source Oral, height 5\' 8"  (1.727 m), weight 91.9 kg (202 lb 9.6 oz). CONSTITUTIONAL: NAD, well nourished EYES: Pupils are equal, round, and reactive to light, Sclera are non-icteric. EARS, NOSE, MOUTH AND THROAT: The oropharynx is clear. The oral mucosa is pink and moist. Hearing is intact to voice. LYMPH NODES:  Lymph nodes in the neck are normal. RESPIRATORY:  Lungs are clear. There is normal respiratory effort, with equal breath sounds bilaterally, and without pathologic use of accessory muscles. CARDIOVASCULAR: Heart is regular without murmurs, gallops, or rubs. GI: Reducible RIH. No peritonitis The abdomen is  soft, nontender, and nondistended. There are no palpable  masses. There is no hepatosplenomegaly. There are normal bowel sounds in all quadrants. GU: Rectal deferred.   MUSCULOSKELETAL: Normal muscle strength and tone. No cyanosis or edema.   SKIN: Turgor is good and there are no pathologic skin lesions or ulcers. NEUROLOGIC: Motor and sensation is grossly normal. Cranial nerves are grossly intact. PSYCH:  Oriented to person, place and time. Affect is normal.  Data Reviewed  I have personally reviewed the patient's imaging, laboratory findings and medical records.    Assessment/Plan Symptomatic right inguinal hernia.  Discussed with the patient in detail about options or observation versus surgical management. I do think that he is a good candidate for laparoscopic repair. Discussed with him also about laparoscopic versus open approaches. He is interested in proceeding with laparoscopic approach. Currently he is going to go on a trip with some family and will be back next month. He wants to schedule surgery for next month if possible. I have explained the procedure, risks, and aftercare of inguinal hernia repair to Clayton Reyes.   Risks include but are not limited to bleeding, infection, wound problems, anesthesia, recurrence, bladder or intestine injury, urinary retention, testicular dysfunction, chronic pain, mesh problems.  He  seems to understand and agrees to proceed.  Questions were answered to his stated satisfaction. A copy of therefore will be sent to the referring provider  Clayton Hamman, MD FACS General Surgeon 10/30/2016, 8:09 PM

## 2016-10-30 NOTE — Telephone Encounter (Signed)
Call returned to patient at this time. He will return phone call when he is available to talk.

## 2016-10-30 NOTE — Telephone Encounter (Signed)
Patient's wife has a confllict with previous surgery date given to patient. He would like to reschedule his surgery for 12/16/16- Wednesday with Dr. Dahlia Byes.

## 2016-10-30 NOTE — Telephone Encounter (Signed)
Surgery has been scheduled for 12/16/16 with Dr Dahlia Byes. We will discuss surgery information at upcoming appointment on 12/03/16.

## 2016-10-30 NOTE — Telephone Encounter (Signed)
Patient called asked if you would give him a call about scheduling. Please call patient.

## 2016-11-09 ENCOUNTER — Telehealth: Payer: Self-pay | Admitting: Surgery

## 2016-11-09 NOTE — Telephone Encounter (Signed)
Pt advised of pre op date/time and sx date. Sx: 10/17 with Dr Pabon--Laparoscopic right inguinal hernia repair.  Pre op: 12/07/16 between 9-1:00pm--Phone.   Patient made aware to call [phone], between 1-3:00pm the day before surgery, to find out what time to arrive.

## 2016-12-03 ENCOUNTER — Ambulatory Visit (INDEPENDENT_AMBULATORY_CARE_PROVIDER_SITE_OTHER): Payer: Medicare Other | Admitting: Surgery

## 2016-12-03 ENCOUNTER — Encounter: Payer: Self-pay | Admitting: Surgery

## 2016-12-03 VITALS — BP 144/80 | HR 65 | Temp 98.2°F | Ht 68.0 in | Wt 201.0 lb

## 2016-12-03 DIAGNOSIS — K4090 Unilateral inguinal hernia, without obstruction or gangrene, not specified as recurrent: Secondary | ICD-10-CM | POA: Diagnosis not present

## 2016-12-03 NOTE — Patient Instructions (Signed)
You have chose to have your hernia repaired. This will be done by Dr. Dahlia Byes on 12/16/16 at [facility].  Please see your (blue) Pre-care information that you have been given today.  You will need to arrange to be out of work for 2 weeks and then return with a lifting restrictions for 4 more weeks. Please send any FMLA paperwork prior to surgery and we will fill this out and fax it back to your employer within 3 business days.  You may have a bruise in your groin and also swelling and brusing in your testicle area. You may use ice 4-5 times daily for 15-20 minutes each time. Make sure that you place a barrier between you and the ice pack. To decrease the swelling, you may roll up a bath towel and place it vertically in between your thighs with your testicles resting on the towel. You will want to keep this area elevated as much as possible for several days following surgery.    Inguinal Hernia, Adult Muscles help keep everything in the body in its proper place. But if a weak spot in the muscles develops, something can poke through. That is called a hernia. When this happens in the lower part of the belly (abdomen), it is called an inguinal hernia. (It takes its name from a part of the body in this region called the inguinal canal.) A weak spot in the wall of muscles lets some fat or part of the small intestine bulge through. An inguinal hernia can develop at any age. Men get them more often than women. CAUSES  In adults, an inguinal hernia develops over time.  It can be triggered by:  Suddenly straining the muscles of the lower abdomen.  Lifting heavy objects.  Straining to have a bowel movement. Difficult bowel movements (constipation) can lead to this.  Constant coughing. This may be caused by smoking or lung disease.  Being overweight.  Being pregnant.  Working at a job that requires long periods of standing or heavy lifting.  Having had an inguinal hernia before. One type can be an  emergency situation. It is called a strangulated inguinal hernia. It develops if part of the small intestine slips through the weak spot and cannot get back into the abdomen. The blood supply can be cut off. If that happens, part of the intestine may die. This situation requires emergency surgery. SYMPTOMS  Often, a small inguinal hernia has no symptoms. It is found when a healthcare provider does a physical exam. Larger hernias usually have symptoms.   In adults, symptoms may include:  A lump in the groin. This is easier to see when the person is standing. It might disappear when lying down.  In men, a lump in the scrotum.  Pain or burning in the groin. This occurs especially when lifting, straining or coughing.  A dull ache or feeling of pressure in the groin.  Signs of a strangulated hernia can include:  A bulge in the groin that becomes very painful and tender to the touch.  A bulge that turns red or purple.  Fever, nausea and vomiting.  Inability to have a bowel movement or to pass gas. DIAGNOSIS  To decide if you have an inguinal hernia, a healthcare provider will probably do a physical examination.  This will include asking questions about any symptoms you have noticed.  The healthcare provider might feel the groin area and ask you to cough. If an inguinal hernia is felt, the healthcare provider  may try to slide it back into the abdomen.  Usually no other tests are needed. TREATMENT  Treatments can vary. The size of the hernia makes a difference. Options include:  Watchful waiting. This is often suggested if the hernia is small and you have had no symptoms.  No medical procedure will be done unless symptoms develop.  You will need to watch closely for symptoms. If any occur, contact your healthcare provider right away.  Surgery. This is used if the hernia is larger or you have symptoms.  Open surgery. This is usually an outpatient procedure (you will not stay  overnight in a hospital). An cut (incision) is made through the skin in the groin. The hernia is put back inside the abdomen. The weak area in the muscles is then repaired by herniorrhaphy or hernioplasty. Herniorrhaphy: in this type of surgery, the weak muscles are sewn back together. Hernioplasty: a patch or mesh is used to close the weak area in the abdominal wall.  Laparoscopy. In this procedure, a surgeon makes small incisions. A thin tube with a tiny video camera (called a laparoscope) is put into the abdomen. The surgeon repairs the hernia with mesh by looking with the video camera and using two long instruments. HOME CARE INSTRUCTIONS   After surgery to repair an inguinal hernia:  You will need to take pain medicine prescribed by your healthcare provider. Follow all directions carefully.  You will need to take care of the wound from the incision.  Your activity will be restricted for awhile. This will probably include no heavy lifting for several weeks. You also should not do anything too active for a few weeks. When you can return to work will depend on the type of job that you have.  During "watchful waiting" periods, you should:  Maintain a healthy weight.  Eat a diet high in fiber (fruits, vegetables and whole grains).  Drink plenty of fluids to avoid constipation. This means drinking enough water and other liquids to keep your urine clear or pale yellow.  Do not lift heavy objects.  Do not stand for long periods of time.  Quit smoking. This should keep you from developing a frequent cough. SEEK MEDICAL CARE IF:   A bulge develops in your groin area.  You feel pain, a burning sensation or pressure in the groin. This might be worse if you are lifting or straining.  You develop a fever of more than 100.5 F (38.1 C). SEEK IMMEDIATE MEDICAL CARE IF:   Pain in the groin increases suddenly.  A bulge in the groin gets bigger suddenly and does not go down.  For men, there  is sudden pain in the scrotum. Or, the size of the scrotum increases.  A bulge in the groin area becomes red or purple and is painful to touch.  You have nausea or vomiting that does not go away.  You feel your heart beating much faster than normal.  You cannot have a bowel movement or pass gas.  You develop a fever of more than 102.0 F (38.9 C).   This information is not intended to replace advice given to you by your health care provider. Make sure you discuss any questions you have with your health care provider.   Document Released: 07/05/2008 Document Revised: 05/11/2011 Document Reviewed: 08/20/2014 Elsevier Interactive Patient Education [employer].

## 2016-12-03 NOTE — Progress Notes (Addendum)
Outpatient Surgical Follow Up  12/03/2016  Clayton Reyes is an 71 y.o. male.   Chief Complaint  Patient presents with  . Follow-up    Right Inguinal Hernia    HPI:  20 well known to me for Symptomatic inguinal hernia. He continues to have right inguinal pain that is intermittent exacerbated by Valsalva maneuvers.  Pain is mild to moderate intensity and sharp in nature. He is able to perform more than 6 Mets of activity without any shortness of breath or chest pain. He did have an umbilical hernia with mesh years  Ago.  Past Medical History:  Diagnosis Date  . Anemia   . Headache(784.0)   . Hypertension     Past Surgical History:  Procedure Laterality Date  . COLONOSCOPY WITH PROPOFOL N/A 04/09/2016   Procedure: COLONOSCOPY WITH PROPOFOL;  Surgeon: Lollie Sails, MD;  Location: [facility] END[facility]COPY;  Service: Endoscopy;  Laterality: N/A;  . CYST REMOVAL HAND Left   . CYST REMOVAL NECK Right   . HERNIA REPAIR    . LUMBAR LAMINECTOMY/DECOMPRESSION MICRODISCECTOMY Right 06/06/2012   Procedure: LUMBAR LAMINECTOMY/DECOMPRESSION MICRODISCECTOMY 1 LEVEL;  Surgeon: Elaina Hoops, MD;  Location: [place] NEURO ORS;  Service: Neurosurgery;  Laterality: Right;  Right Lumbar Three-Four Decompressive Laminectomy and Microdiskectomy  . ORIF ORBITAL FRACTURE Left 1971    Family History  Problem Relation Age of Onset  . Diabetes Mother   . Stroke Mother   . Parkinson's disease Father   . Heart disease Father   . Stroke Sister   . Diabetes Sister     Social History:  reports that he has quit smoking. His smoking use included Cigarettes. He has a 20.00 pack-year smoking history. He has never used smokeless tobacco. He reports that he drinks alcohol. He reports that he does not use drugs.  Allergies: No Known Allergies  Medications reviewed.    ROS Full ROS performed and is otherwise negative other than what is stated in HPI   BP (!) 144/80   Pulse 65   Temp 98.2 F (36.8 C) (Oral)   Ht  5\' 8"  (1.727 m)   Wt 91.2 kg (201 lb)   BMI 30.56 kg/m   Physical Exam  Constitutional: He is oriented to person, place, and time and well-developed, well-nourished, and in no distress. No distress.  Neck: Normal range of motion. No JVD present.  Cardiovascular: Normal rate, regular rhythm and normal heart sounds.   Pulmonary/Chest: Effort normal. No stridor. No respiratory distress. He has no wheezes.  Abdominal: Soft. Bowel sounds are normal. He exhibits no mass. There is no rebound and no guarding.  Reducible RIH, mild tenderness  Musculoskeletal: Normal range of motion. He exhibits no edema.  Neurological: He is alert and oriented to person, place, and time. Gait normal. GCS score is 15.  Skin: Skin is warm and dry. He is not diaphoretic.  Psychiatric: Mood, memory, affect and judgment normal.  Nursing note and vitals reviewed.    Assessment/Plan: Symptomatic right inguinal hernia in need for repair. Discussed with the patient in detail about open versus laparoscopic approach. I Do think that is an excellent candidate for laparoscopic extraperitoneal repair with mesh. I have explained the procedure, risks, and aftercare of inguinal hernia repair to [name].   Risks include but are not limited to bleeding, infection, wound problems, anesthesia, recurrence, bladder or intestine injury, urinary retention, testicular dysfunction, chronic pain, mesh problems.  He  seems to understand and agrees to proceed.  Questions  were answered to his stated satisfaction.   Caroleen Hamman, MD [facility] General Surgeon

## 2016-12-07 ENCOUNTER — Encounter
Admission: RE | Admit: 2016-12-07 | Discharge: 2016-12-07 | Disposition: A | Discharge status: Home / Self Care | Payer: Medicare Other | Source: Ambulatory Visit | Attending: Surgery | Admitting: Surgery

## 2016-12-07 DIAGNOSIS — Z01812 Encounter for preprocedural laboratory examination: Secondary | ICD-10-CM | POA: Insufficient documentation

## 2016-12-07 DIAGNOSIS — Z01810 Encounter for preprocedural cardiovascular examination: Secondary | ICD-10-CM | POA: Insufficient documentation

## 2016-12-07 DIAGNOSIS — I4510 Unspecified right bundle-branch block: Secondary | ICD-10-CM | POA: Insufficient documentation

## 2016-12-07 DIAGNOSIS — I10 Essential (primary) hypertension: Secondary | ICD-10-CM | POA: Insufficient documentation

## 2016-12-07 DIAGNOSIS — M1990 Unspecified osteoarthritis, unspecified site: Secondary | ICD-10-CM | POA: Insufficient documentation

## 2016-12-07 DIAGNOSIS — R51 Headache: Secondary | ICD-10-CM | POA: Insufficient documentation

## 2016-12-07 DIAGNOSIS — D649 Anemia, unspecified: Secondary | ICD-10-CM | POA: Insufficient documentation

## 2016-12-07 HISTORY — DX: Unspecified osteoarthritis, unspecified site: M19.90

## 2016-12-07 NOTE — Patient Instructions (Signed)
Your procedure is scheduled on: Oct. 17, 2018. Report to Same Day Surgery To find out your arrival time please call [phone] between 1PM - 3PM on Tuesday Oct. 16, 2018.  Remember: Instructions that are not followed completely may result in serious medical risk, up to and including death, or upon the discretion of your surgeon and anesthesiologist your surgery may need to be rescheduled.     _X__ 1. Do not eat food after midnight the night before your procedure.                 No gum chewing or hard candies. You may drink clear liquids up to 2 hours                 before you are scheduled to arrive for your surgery- DO not drink clear                 liquids within 2 hours of the start of your surgery.                 Clear Liquids include:  water, apple juice without pulp, clear carbohydrate                 drink such as Clearfast of Gartorade, Black Coffee or Tea (Do not add                 anything to coffee or tea).     _X__ 2.  No Alcohol for 24 hours before or after surgery.   ___ 3.  Do Not Smoke or use e-cigarettes For 24 Hours Prior to Your Surgery.                 Do not use any chewable tobacco products for at least 6 hours prior to                 surgery.  ____  4.  Bring all medications with you on the day of surgery if instructed.   _x___  5.  Notify your doctor if there is any change in your medical condition      (cold, fever, infections).     Do not wear jewelry, make-up, hairpins, clips or nail polish. Do not wear lotions, powders, or perfumes. You may wear deodorant. Do not shave 48 hours prior to surgery. Men may shave face and neck. Do not bring valuables to the hospital.    [facility] is not responsible for any belongings or valuables.  Contacts, dentures or bridgework may not be worn into surgery. Leave your suitcase in the car. After surgery it may be brought to your room. For patients admitted to the hospital, discharge time  is determined by your treatment team.   Patients discharged the day of surgery will not be allowed to drive home.   Please read over the following fact sheets that you were given:   Preparing for Surgery.         __x__ Take these medicines the morning of surgery with A SIP OF WATER:    1. gabapentin (NEURONTIN)   ____ Fleet Enema (as directed)   __x__ Use CHG Soap as directed  ____ Use inhalers on the day of surgery  ____ Stop metformin 2 days prior to surgery    ____ Take 1/2 of usual insulin dose the night before surgery. No insulin the morning          of surgery.   ____ Stop Coumadin/Plavix/aspirin on does  not apply.  ____ Stop Anti-inflammatories on: gabapentin (NEURONTIN) Oct. 10, 2018   ____ Stop supplements:Omega-3 Fatty Acids ([place] OIL)until after surgery.    ____ Bring C-Pap to the hospital.

## 2016-12-08 ENCOUNTER — Encounter
Admission: RE | Admit: 2016-12-08 | Discharge: 2016-12-08 | Disposition: A | Discharge status: Home / Self Care | Payer: Medicare Other | Source: Ambulatory Visit | Attending: Surgery | Admitting: Surgery

## 2016-12-08 DIAGNOSIS — D649 Anemia, unspecified: Secondary | ICD-10-CM | POA: Diagnosis not present

## 2016-12-08 DIAGNOSIS — Z01812 Encounter for preprocedural laboratory examination: Secondary | ICD-10-CM | POA: Diagnosis present

## 2016-12-08 DIAGNOSIS — I4510 Unspecified right bundle-branch block: Secondary | ICD-10-CM | POA: Diagnosis not present

## 2016-12-08 DIAGNOSIS — R51 Headache: Secondary | ICD-10-CM | POA: Diagnosis not present

## 2016-12-08 DIAGNOSIS — I10 Essential (primary) hypertension: Secondary | ICD-10-CM | POA: Diagnosis not present

## 2016-12-08 DIAGNOSIS — M1990 Unspecified osteoarthritis, unspecified site: Secondary | ICD-10-CM | POA: Diagnosis not present

## 2016-12-08 DIAGNOSIS — Z01810 Encounter for preprocedural cardiovascular examination: Secondary | ICD-10-CM | POA: Diagnosis present

## 2016-12-08 LAB — POTASSIUM: POTASSIUM: 3.2 mmol/L — AB (ref 3.5–5.1)

## 2016-12-09 ENCOUNTER — Other Ambulatory Visit: Payer: TRICARE For Life (TFL)

## 2016-12-15 ENCOUNTER — Other Ambulatory Visit: Payer: Self-pay | Admitting: Physical Medicine and Rehabilitation

## 2016-12-15 DIAGNOSIS — M5416 Radiculopathy, lumbar region: Secondary | ICD-10-CM

## 2016-12-15 MED ORDER — CEFAZOLIN SODIUM-DEXTROSE 2-4 GM/100ML-% IV SOLN
2.0000 g | INTRAVENOUS | Status: AC
  Administered 2016-12-16: 2 g via INTRAVENOUS

## 2016-12-16 ENCOUNTER — Encounter: Payer: Self-pay | Admitting: *Deleted

## 2016-12-16 ENCOUNTER — Encounter
Admission: RE | Disposition: A | Discharge status: Home / Self Care | Payer: Self-pay | Source: Ambulatory Visit | Attending: Surgery

## 2016-12-16 ENCOUNTER — Ambulatory Visit
Admission: RE | Admit: 2016-12-16 | Discharge: 2016-12-16 | Disposition: A | Discharge status: Home / Self Care | Payer: Medicare Other | Source: Ambulatory Visit | Attending: Surgery | Admitting: Surgery

## 2016-12-16 ENCOUNTER — Ambulatory Visit: Payer: Medicare Other | Admitting: Certified Registered Nurse Anesthetist

## 2016-12-16 DIAGNOSIS — R51 Headache: Secondary | ICD-10-CM | POA: Insufficient documentation

## 2016-12-16 DIAGNOSIS — Z820 Family history of epilepsy and other diseases of the nervous system: Secondary | ICD-10-CM | POA: Diagnosis not present

## 2016-12-16 DIAGNOSIS — D649 Anemia, unspecified: Secondary | ICD-10-CM | POA: Diagnosis not present

## 2016-12-16 DIAGNOSIS — Z87891 Personal history of nicotine dependence: Secondary | ICD-10-CM | POA: Diagnosis not present

## 2016-12-16 DIAGNOSIS — D176 Benign lipomatous neoplasm of spermatic cord: Secondary | ICD-10-CM | POA: Insufficient documentation

## 2016-12-16 DIAGNOSIS — K4090 Unilateral inguinal hernia, without obstruction or gangrene, not specified as recurrent: Secondary | ICD-10-CM

## 2016-12-16 DIAGNOSIS — Z8249 Family history of ischemic heart disease and other diseases of the circulatory system: Secondary | ICD-10-CM | POA: Diagnosis not present

## 2016-12-16 DIAGNOSIS — Z823 Family history of stroke: Secondary | ICD-10-CM | POA: Diagnosis not present

## 2016-12-16 DIAGNOSIS — I10 Essential (primary) hypertension: Secondary | ICD-10-CM | POA: Insufficient documentation

## 2016-12-16 DIAGNOSIS — Z833 Family history of diabetes mellitus: Secondary | ICD-10-CM | POA: Insufficient documentation

## 2016-12-16 HISTORY — PX: INGUINAL HERNIA REPAIR: SHX194

## 2016-12-16 LAB — POCT ACTIVATED CLOTTING TIME: Activated Clotting Time: 0 seconds

## 2016-12-16 LAB — POTASSIUM: POTASSIUM: 3.7 mmol/L (ref 3.5–5.1)

## 2016-12-16 SURGERY — REPAIR, HERNIA, INGUINAL, LAPAROSCOPIC
Anesthesia: General | Laterality: Right

## 2016-12-16 MED ORDER — BUPIVACAINE-EPINEPHRINE (PF) 0.25% -1:200000 IJ SOLN
INTRAMUSCULAR | Status: DC | PRN
  Administered 2016-12-16: 30 mL via PERINEURAL

## 2016-12-16 MED ORDER — LIDOCAINE HCL (CARDIAC) 20 MG/ML IV SOLN
INTRAVENOUS | Status: DC | PRN
  Administered 2016-12-16: 100 mg via INTRAVENOUS

## 2016-12-16 MED ORDER — ACETAMINOPHEN 10 MG/ML IV SOLN
INTRAVENOUS | Status: AC
  Filled 2016-12-16: qty 100

## 2016-12-16 MED ORDER — SUGAMMADEX SODIUM 200 MG/2ML IV SOLN
INTRAVENOUS | Status: DC | PRN
  Administered 2016-12-16: 200 mg via INTRAVENOUS

## 2016-12-16 MED ORDER — KETOROLAC TROMETHAMINE 30 MG/ML IJ SOLN
INTRAMUSCULAR | Status: AC
  Filled 2016-12-16: qty 1

## 2016-12-16 MED ORDER — FAMOTIDINE 20 MG PO TABS
ORAL_TABLET | ORAL | Status: AC
  Administered 2016-12-16: 20 mg via ORAL
  Filled 2016-12-16: qty 1

## 2016-12-16 MED ORDER — DEXAMETHASONE SODIUM PHOSPHATE 10 MG/ML IJ SOLN
INTRAMUSCULAR | Status: AC
  Filled 2016-12-16: qty 1

## 2016-12-16 MED ORDER — SUGAMMADEX SODIUM 200 MG/2ML IV SOLN
INTRAVENOUS | Status: AC
  Filled 2016-12-16: qty 2

## 2016-12-16 MED ORDER — MIDAZOLAM HCL 2 MG/2ML IJ SOLN
INTRAMUSCULAR | Status: AC
  Filled 2016-12-16: qty 2

## 2016-12-16 MED ORDER — PROPOFOL 10 MG/ML IV BOLUS
INTRAVENOUS | Status: DC | PRN
  Administered 2016-12-16: 170 mg via INTRAVENOUS

## 2016-12-16 MED ORDER — DEXAMETHASONE SODIUM PHOSPHATE 10 MG/ML IJ SOLN
INTRAMUSCULAR | Status: DC | PRN
  Administered 2016-12-16: 10 mg via INTRAVENOUS

## 2016-12-16 MED ORDER — SUCCINYLCHOLINE CHLORIDE 20 MG/ML IJ SOLN
INTRAMUSCULAR | Status: AC
  Filled 2016-12-16: qty 1

## 2016-12-16 MED ORDER — MIDAZOLAM HCL 2 MG/2ML IJ SOLN
INTRAMUSCULAR | Status: DC | PRN
  Administered 2016-12-16: 2 mg via INTRAVENOUS

## 2016-12-16 MED ORDER — GLYCOPYRROLATE 0.2 MG/ML IJ SOLN
INTRAMUSCULAR | Status: DC | PRN
  Administered 2016-12-16: 0.2 mg via INTRAVENOUS

## 2016-12-16 MED ORDER — LIDOCAINE HCL (PF) 2 % IJ SOLN
INTRAMUSCULAR | Status: AC
  Filled 2016-12-16: qty 10

## 2016-12-16 MED ORDER — LACTATED RINGERS IV SOLN
INTRAVENOUS | Status: DC
  Administered 2016-12-16 (×2): via INTRAVENOUS

## 2016-12-16 MED ORDER — ROCURONIUM BROMIDE 50 MG/5ML IV SOLN
INTRAVENOUS | Status: AC
  Filled 2016-12-16: qty 1

## 2016-12-16 MED ORDER — ONDANSETRON HCL 4 MG/2ML IJ SOLN
INTRAMUSCULAR | Status: AC
  Filled 2016-12-16: qty 2

## 2016-12-16 MED ORDER — ONDANSETRON HCL 4 MG/2ML IJ SOLN
INTRAMUSCULAR | Status: DC | PRN
  Administered 2016-12-16: 4 mg via INTRAVENOUS

## 2016-12-16 MED ORDER — FENTANYL CITRATE (PF) 100 MCG/2ML IJ SOLN
INTRAMUSCULAR | Status: AC
  Administered 2016-12-16: 25 ug via INTRAVENOUS
  Filled 2016-12-16: qty 2

## 2016-12-16 MED ORDER — BUPIVACAINE-EPINEPHRINE (PF) 0.25% -1:200000 IJ SOLN
INTRAMUSCULAR | Status: AC
  Filled 2016-12-16: qty 30

## 2016-12-16 MED ORDER — KETOROLAC TROMETHAMINE 30 MG/ML IJ SOLN
INTRAMUSCULAR | Status: DC | PRN
  Administered 2016-12-16: 30 mg via INTRAVENOUS

## 2016-12-16 MED ORDER — EPHEDRINE SULFATE 50 MG/ML IJ SOLN
INTRAMUSCULAR | Status: DC | PRN
  Administered 2016-12-16: 5 mg via INTRAVENOUS
  Administered 2016-12-16: 10 mg via INTRAVENOUS

## 2016-12-16 MED ORDER — CHLORHEXIDINE GLUCONATE CLOTH 2 % EX PADS: 6.0000 | MEDICATED_PAD | Freq: Once | CUTANEOUS | Status: DC

## 2016-12-16 MED ORDER — GLYCOPYRROLATE 0.2 MG/ML IJ SOLN
INTRAMUSCULAR | Status: AC
Start: 2016-12-16 — End: 2016-12-16
  Filled 2016-12-16: qty 1

## 2016-12-16 MED ORDER — ACETAMINOPHEN 10 MG/ML IV SOLN
INTRAVENOUS | Status: DC | PRN
  Administered 2016-12-16: 1000 mg via INTRAVENOUS

## 2016-12-16 MED ORDER — FENTANYL CITRATE (PF) 100 MCG/2ML IJ SOLN
INTRAMUSCULAR | Status: DC | PRN
  Administered 2016-12-16 (×2): 50 ug via INTRAVENOUS

## 2016-12-16 MED ORDER — FENTANYL CITRATE (PF) 100 MCG/2ML IJ SOLN
INTRAMUSCULAR | Status: AC
  Filled 2016-12-16: qty 2

## 2016-12-16 MED ORDER — CEFAZOLIN SODIUM-DEXTROSE 2-4 GM/100ML-% IV SOLN
INTRAVENOUS | Status: AC
  Filled 2016-12-16: qty 100

## 2016-12-16 MED ORDER — PROPOFOL 10 MG/ML IV BOLUS
INTRAVENOUS | Status: AC
  Filled 2016-12-16: qty 20

## 2016-12-16 MED ORDER — ROCURONIUM BROMIDE 100 MG/10ML IV SOLN
INTRAVENOUS | Status: DC | PRN
  Administered 2016-12-16: 30 mg via INTRAVENOUS

## 2016-12-16 MED ORDER — HYDROCODONE-ACETAMINOPHEN 5-325 MG PO TABS: 1.0000 | ORAL_TABLET | Freq: Four times a day (QID) | ORAL | 0 refills | Status: DC | PRN

## 2016-12-16 MED ORDER — EPHEDRINE SULFATE 50 MG/ML IJ SOLN
INTRAMUSCULAR | Status: AC
Start: 2016-12-16 — End: 2016-12-16
  Filled 2016-12-16: qty 1

## 2016-12-16 MED ORDER — ONDANSETRON HCL 4 MG/2ML IJ SOLN: 4.0000 mg | Freq: Once | INTRAMUSCULAR | Status: DC | PRN

## 2016-12-16 MED ORDER — FAMOTIDINE 20 MG PO TABS
20.0000 mg | ORAL_TABLET | Freq: Once | ORAL | Status: AC
  Administered 2016-12-16: 20 mg via ORAL

## 2016-12-16 MED ORDER — FENTANYL CITRATE (PF) 100 MCG/2ML IJ SOLN
25.0000 ug | INTRAMUSCULAR | Status: DC | PRN
  Administered 2016-12-16 (×2): 25 ug via INTRAVENOUS

## 2016-12-16 SURGICAL SUPPLY — 41 items
ADH SKN CLS APL DERMABOND .7 (GAUZE/BANDAGES/DRESSINGS) ×1
APPLICATOR COTTON TIP 6IN STRL (MISCELLANEOUS) ×1 IMPLANT
BALN DSCT LAPSCP LRG KNDY DSTN (BALLOONS)
CANISTER SUCT 1200ML W/VALVE (MISCELLANEOUS) ×2 IMPLANT
CHLORAPREP W/TINT 26ML (MISCELLANEOUS) ×2 IMPLANT
DEFOGGER SCOPE WARMER CLEARIFY (MISCELLANEOUS) ×1 IMPLANT
DERMABOND ADVANCED (GAUZE/BANDAGES/DRESSINGS) ×1
DERMABOND ADVANCED .7 DNX12 (GAUZE/BANDAGES/DRESSINGS) ×1 IMPLANT
DEVICE SECURE STRAP 25 ABSORB (INSTRUMENTS) ×2 IMPLANT
DISSECT BALLN SPACEMKR OVL PDB (BALLOONS)
DISSECTOR BALLN SPCMKR OVL PDB (BALLOONS) ×1 IMPLANT
DISSECTOR KITTNER STICK (MISCELLANEOUS) ×2 IMPLANT
DISSECTORS/KITTNER STICK (MISCELLANEOUS) ×4
DRAPE INCISE IOBAN 66X45 STRL (DRAPES) ×2 IMPLANT
ELECT REM PT RETURN 9FT ADLT (ELECTROSURGICAL) ×2
ELECTRODE REM PT RTRN 9FT ADLT (ELECTROSURGICAL) ×1 IMPLANT
ENDOLOOP SUT PDS II  0 18 (SUTURE)
ENDOLOOP SUT PDS II 0 18 (SUTURE) IMPLANT
GLOVE BIO SURGEON STRL SZ7 (GLOVE) ×3 IMPLANT
GOWN STRL REUS W/ TWL LRG LVL3 (GOWN DISPOSABLE) ×2 IMPLANT
GOWN STRL REUS W/TWL LRG LVL3 (GOWN DISPOSABLE) ×4
IRRIGATION STRYKERFLOW (MISCELLANEOUS) ×1 IMPLANT
IRRIGATOR STRYKERFLOW (MISCELLANEOUS)
IV NS 1000ML (IV SOLUTION) ×2
IV NS 1000ML BAXH (IV SOLUTION) ×1 IMPLANT
L-HOOK LAP DISP 36CM (ELECTROSURGICAL)
LHOOK LAP DISP 36CM (ELECTROSURGICAL) IMPLANT
MESH 3DMAX 3X5 RT MED (Mesh General) ×1 IMPLANT
NEEDLE HYPO 22GX1.5 SAFETY (NEEDLE) ×2 IMPLANT
NS IRRIG 500ML POUR BTL (IV SOLUTION) ×2 IMPLANT
PACK LAP CHOLECYSTECTOMY (MISCELLANEOUS) ×2 IMPLANT
PENCIL ELECTRO HAND CTR (MISCELLANEOUS) ×2 IMPLANT
SCISSORS METZENBAUM CVD 33 (INSTRUMENTS) ×1 IMPLANT
SPONGE LAP 18X18 5 PK (GAUZE/BANDAGES/DRESSINGS) ×2 IMPLANT
SURGILUBE 2OZ TUBE FLIPTOP (MISCELLANEOUS) ×2 IMPLANT
SUT MNCRL AB 4-0 PS2 18 (SUTURE) ×2 IMPLANT
SUT VICRYL 0 AB UR-6 (SUTURE) ×2 IMPLANT
TRAY FOLEY CATH SILVER 16FR LF (SET/KITS/TRAYS/PACK) IMPLANT
TROCAR 5MM SINGLE VERSAONE (TROCAR) ×4 IMPLANT
TROCAR BALLN 10M OMST10SB SPAC (TROCAR) ×2 IMPLANT
TUBING INSUFFLATOR HI FLOW (MISCELLANEOUS) ×2 IMPLANT

## 2016-12-16 NOTE — Progress Notes (Signed)
Scrotum swollen

## 2016-12-16 NOTE — Anesthesia Postprocedure Evaluation (Signed)
Anesthesia Post Note  Patient: Clayton Reyes  Procedure(s) Performed: LAPAROSCOPIC INGUINAL HERNIA Repair (Right )  Patient location during evaluation: PACU Anesthesia Type: General Level of consciousness: awake and alert Pain management: pain level controlled Vital Signs Assessment: post-procedure vital signs reviewed and stable Respiratory status: spontaneous breathing and respiratory function stable Cardiovascular status: stable Anesthetic complications: no     Last Vitals:  Vitals:   12/16/16 0807 12/16/16 1030  BP: (!) 147/82 140/75  Pulse: 61 77  Resp: 16 11  Temp: 36.4 C (!) 36.3 C  SpO2: 100% 100%    Last Pain:  Vitals:   12/16/16 0807  TempSrc: Oral  PainSc: 2                  KEPHART,WILLIAM K

## 2016-12-16 NOTE — Anesthesia Preprocedure Evaluation (Signed)
Anesthesia Evaluation  Patient identified by MRN, date of birth, ID band Patient awake    Reviewed: Allergy & Precautions, NPO status , Patient's Chart, lab work & pertinent test results  History of Anesthesia Complications Negative for: history of anesthetic complications  Airway Mallampati: II       Dental   Pulmonary neg sleep apnea, neg COPD, former smoker,           Cardiovascular hypertension, Pt. on medications (-) Past MI and (-) CHF (-) dysrhythmias (-) Valvular Problems/Murmurs     Neuro/Psych neg Seizures    GI/Hepatic neg GERD  ,  Endo/Other  negative endocrine ROS  Renal/GU negative Renal ROS     Musculoskeletal   Abdominal   Peds  Hematology   Anesthesia Other Findings   Reproductive/Obstetrics                             Anesthesia Physical Anesthesia Plan  ASA: II  Anesthesia Plan: General   Post-op Pain Management:    Induction: Intravenous  PONV Risk Score and Plan: 2 and Ondansetron and Dexamethasone  Airway Management Planned: Oral ETT  Additional Equipment:   Intra-op Plan:   Post-operative Plan:   Informed Consent: I have reviewed the patients History and Physical, chart, labs and discussed the procedure including the risks, benefits and alternatives for the proposed anesthesia with the patient or authorized representative who has indicated his/her understanding and acceptance.     Plan Discussed with:   Anesthesia Plan Comments:         Anesthesia Quick Evaluation

## 2016-12-16 NOTE — H&P (View-Only) (Signed)
Outpatient Surgical Follow Up  12/03/2016  Clayton Reyes is an 71 y.o. male.   Chief Complaint  Patient presents with  . Follow-up    Right Inguinal Hernia    HPI:  75 well known to me for Symptomatic inguinal hernia. He continues to have right inguinal pain that is intermittent exacerbated by Valsalva maneuvers.  Pain is mild to moderate intensity and sharp in nature. He is able to perform more than 6 Mets of activity without any shortness of breath or chest pain. He did have an umbilical hernia with mesh years  Ago.  Past Medical History:  Diagnosis Date  . Anemia   . Headache(784.0)   . Hypertension     Past Surgical History:  Procedure Laterality Date  . COLONOSCOPY WITH PROPOFOL N/A 04/09/2016   Procedure: COLONOSCOPY WITH PROPOFOL;  Surgeon: Lollie Sails, MD;  Location: [facility] ENDOSCOPY;  Service: Endoscopy;  Laterality: N/A;  . CYST REMOVAL HAND Left   . CYST REMOVAL NECK Right   . HERNIA REPAIR    . LUMBAR LAMINECTOMY/DECOMPRESSION MICRODISCECTOMY Right 06/06/2012   Procedure: LUMBAR LAMINECTOMY/DECOMPRESSION MICRODISCECTOMY 1 LEVEL;  Surgeon: Elaina Hoops, MD;  Location: [place] NEURO ORS;  Service: Neurosurgery;  Laterality: Right;  Right Lumbar Three-Four Decompressive Laminectomy and Microdiskectomy  . ORIF ORBITAL FRACTURE Left 1971    Family History  Problem Relation Age of Onset  . Diabetes Mother   . Stroke Mother   . Parkinson's disease Father   . Heart disease Father   . Stroke Sister   . Diabetes Sister     Social History:  reports that he has quit smoking. His smoking use included Cigarettes. He has a 20.00 pack-year smoking history. He has never used smokeless tobacco. He reports that he drinks alcohol. He reports that he does not use drugs.  Allergies: No Known Allergies  Medications reviewed.    ROS Full ROS performed and is otherwise negative other than what is stated in HPI   BP (!) 144/80   Pulse 65   Temp 98.2 F (36.8 C) (Oral)   Ht  5\' 8"  (1.727 m)   Wt 91.2 kg (201 lb)   BMI 30.56 kg/m   Physical Exam  Constitutional: He is oriented to person, place, and time and well-developed, well-nourished, and in no distress. No distress.  Neck: Normal range of motion. No JVD present.  Cardiovascular: Normal rate, regular rhythm and normal heart sounds.   Pulmonary/Chest: Effort normal. No stridor. No respiratory distress. He has no wheezes.  Abdominal: Soft. Bowel sounds are normal. He exhibits no mass. There is no rebound and no guarding.  Reducible RIH, mild tenderness  Musculoskeletal: Normal range of motion. He exhibits no edema.  Neurological: He is alert and oriented to person, place, and time. Gait normal. GCS score is 15.  Skin: Skin is warm and dry. He is not diaphoretic.  Psychiatric: Mood, memory, affect and judgment normal.  Nursing note and vitals reviewed.    Assessment/Plan: Symptomatic right inguinal hernia in need for repair. Discussed with the patient in detail about open versus laparoscopic approach. I Do think that is an excellent candidate for laparoscopic extraperitoneal repair with mesh. I have explained the procedure, risks, and aftercare of inguinal hernia repair to Clayton Reyes.   Risks include but are not limited to bleeding, infection, wound problems, anesthesia, recurrence, bladder or intestine injury, urinary retention, testicular dysfunction, chronic pain, mesh problems.  He  seems to understand and agrees to proceed.  Questions  were answered to his stated satisfaction.   Caroleen Hamman, MD [facility] General Surgeon

## 2016-12-16 NOTE — Discharge Instructions (Signed)
AMBULATORY SURGERY  °DISCHARGE INSTRUCTIONS ° ° °1) The drugs that you were given will stay in your system until tomorrow so for the next 24 hours you should not: ° °A) Drive an automobile °B) Make any legal decisions °C) Drink any alcoholic beverage ° ° °2) You may resume regular meals tomorrow.  Today it is better to start with liquids and gradually work up to solid foods. ° °You may eat anything you prefer, but it is better to start with liquids, then soup and crackers, and gradually work up to solid foods. ° ° °3) Please notify your doctor immediately if you have any unusual bleeding, trouble breathing, redness and pain at the surgery site, drainage, fever, or pain not relieved by medication. ° ° ° °4) Additional Instructions: ° ° ° ° ° ° ° °Please contact your physician with any problems or Same Day Surgery at [phone], Monday through Friday 6 am to 4 pm, or [place] at [place] [facility] number at [phone]. °

## 2016-12-16 NOTE — Anesthesia Post-op Follow-up Note (Signed)
Anesthesia QCDR form completed.        

## 2016-12-16 NOTE — Op Note (Signed)
Laparoscopic Right Inguinal Hernia Repair  Clayton Reyes  12/16/2016  Pre-operative Diagnosis: Right Inguinal Hernia  Post-operative Diagnosis: Same  Procedure: Laparoscopic preperitoneal repair of Right inguinal hernia w 3D MESH  Surgeon: Caroleen Hamman, MD FACS  Anesthesia: Gen. with endotracheal tube  Findings: Right direct IH  Procedure Details  The patient was seen again in the Holding Room. The benefits, complications, treatment options, and expected outcomes were discussed with the patient. The risks of bleeding, infection, recurrence of symptoms, failure to resolve symptoms, recurrence of hernia, ischemic orchitis, chronic pain syndrome or neuroma, were discussed again. The likelihood of improving the patient's symptoms with return to their baseline status is good.  The patient and/or family concurred with the proposed plan, giving informed consent.  The patient was taken to Operating Room, identified as Clayton Reyes and the procedure verified as Laparoscopic Inguinal Hernia Repair. Laterality confirmed.  A Time Out was held and the above information confirmed.  Prior to the induction of general anesthesia, antibiotic prophylaxis was administered. VTE prophylaxis was in place. General endotracheal anesthesia was then administered and tolerated well. After the induction, the abdomen was prepped with Chloraprep and draped in the sterile fashion. The patient was positioned in the supine position.  Local anesthetic  was injected into the skin near the umbilicus and an incision made. An incision was made and dissection down to the rectus fascia was performed. The fascia was incised and the muscle retracted laterally. The Covidien dissecting balloon was placed followed by the structural balloon. The preperitoneal space was insufflated and under direct vision 2 midline 5 mm ports were placed.  Dissection was performed to delineate Cooper's ligament and the lateral extent of dissection was  determined on each side. The nerve on the lateral abdominal wall was identified and kept in view at all times. The cord was skeletonized of the  sac and cord lipoma which was retracted cephalad on each side.  Direct defect visualized.  A medium 3 D mesh ( BARD) was placed into the preperitoneal space on each side. They were held in place with the absorbable tacking device avoiding the area of the nerve. Once assuring that the hernia  was completely repaired and adequately covered, the preperitoneal space was desufflated under direct vision. There was no sign of peritoneal rent and no sign of bowel intrusion towards the mesh.  Once assuring that hemostasis was adequate the ports were removed and a figure-of-eight 0 Vicryl suture was placed at the fascial edges. 4-0 subcuticular Monocryl was used at all skin edges. Dermabond applied/  Patient tolerated the procedure well. There were no complications. He was taken to the recovery room in stable condition.               Caroleen Hamman, MD, FACS

## 2016-12-16 NOTE — Interval H&P Note (Signed)
History and Physical Interval Note:  12/16/2016 8:54 AM  Clayton Reyes  has presented today for surgery, with the diagnosis of hernia  The various methods of treatment have been discussed with the patient and family. After consideration of risks, benefits and other options for treatment, the patient has consented to  Procedure(s): LAPAROSCOPIC INGUINAL HERNIA Repair (Right) as a surgical intervention .  The patient's history has been reviewed, patient examined, no change in status, stable for surgery.  I have reviewed the patient's chart and labs.  Questions were answered to the patient's satisfaction.     [place]

## 2016-12-16 NOTE — Transfer of Care (Signed)
Immediate Anesthesia Transfer of Care Note  Patient: Clayton Reyes  Procedure(s) Performed: LAPAROSCOPIC INGUINAL HERNIA Repair (Right )  Patient Location: PACU  Anesthesia Type:General  Level of Consciousness: sedated  Airway & Oxygen Therapy: Patient Spontanous Breathing and Patient connected to face mask oxygen  Post-op Assessment: Report given to RN and Post -op Vital signs reviewed and stable  Post vital signs: Reviewed and stable  Last Vitals:  Vitals:   12/16/16 0807  BP: (!) 147/82  Pulse: 61  Resp: 16  Temp: 36.4 C  SpO2: 100%    Last Pain:  Vitals:   12/16/16 0807  TempSrc: Oral  PainSc: 2          Complications: No apparent anesthesia complications

## 2016-12-16 NOTE — Anesthesia Procedure Notes (Signed)
Procedure Name: Intubation Date/Time: 12/16/2016 9:28 AM Performed by: Johnna Acosta Pre-anesthesia Checklist: Patient identified, Emergency Drugs available, Suction available, Patient being monitored and Timeout performed Patient Re-evaluated:Patient Re-evaluated prior to induction Oxygen Delivery Method: Circle system utilized Preoxygenation: Pre-oxygenation with 100% oxygen Induction Type: IV induction Ventilation: Mask ventilation without difficulty Laryngoscope Size: Miller and 2 Grade View: Grade I Tube type: Oral Tube size: 7.5 mm Number of attempts: 1 Airway Equipment and Method: Stylet Placement Confirmation: ETT inserted through vocal cords under direct vision,  positive ETCO2 and breath sounds checked- equal and bilateral Secured at: 21 cm Tube secured with: Tape Dental Injury: Teeth and Oropharynx as per pre-operative assessment

## 2016-12-17 ENCOUNTER — Telehealth: Payer: Self-pay

## 2016-12-17 NOTE — Telephone Encounter (Signed)
Post-op call made to patient at this time. Spoke with Clayton Reyes. Post-op interview questions below.  1. How are you feeling? Having some soreness and a little bit of pain  2. Is your pain controlled? Yes  3. What are you doing for the pain? Taking pain medication as needed  4. Are you having any Nausea or Vomiting? None  5. Are you having any Fever or Chills? None  6. Are you having any Constipation or Diarrhea? Has yet to have a bowel movement. Advised to try miralax and when doing so to drink 8-9 glasses of water.  7. Is there any Swelling or Bruising you are concerned about? Some bruising using ice to help with that.  8. Do you have any questions or concerns at this time? None   Discussion: Reminded of post op appointment on 10/25 at 3:30PM. Advised to give our office a call if he has any questions or concerns prior to this appointment. Patient verbalized understanding.

## 2016-12-22 ENCOUNTER — Ambulatory Visit
Admission: RE | Admit: 2016-12-22 | Discharge: 2016-12-22 | Disposition: A | Discharge status: Home / Self Care | Payer: Medicare Other | Source: Ambulatory Visit | Attending: Physical Medicine and Rehabilitation | Admitting: Physical Medicine and Rehabilitation

## 2016-12-22 DIAGNOSIS — M5416 Radiculopathy, lumbar region: Secondary | ICD-10-CM | POA: Diagnosis present

## 2016-12-22 DIAGNOSIS — M48061 Spinal stenosis, lumbar region without neurogenic claudication: Secondary | ICD-10-CM | POA: Insufficient documentation

## 2016-12-22 DIAGNOSIS — M5126 Other intervertebral disc displacement, lumbar region: Secondary | ICD-10-CM | POA: Diagnosis not present

## 2016-12-24 ENCOUNTER — Ambulatory Visit (INDEPENDENT_AMBULATORY_CARE_PROVIDER_SITE_OTHER): Payer: Medicare Other | Admitting: Surgery

## 2016-12-24 ENCOUNTER — Encounter: Payer: Self-pay | Admitting: Surgery

## 2016-12-24 VITALS — BP 118/78 | HR 80 | Temp 98.7°F | Ht 68.0 in | Wt 201.0 lb

## 2016-12-24 DIAGNOSIS — Z09 Encounter for follow-up examination after completed treatment for conditions other than malignant neoplasm: Secondary | ICD-10-CM

## 2016-12-24 NOTE — Progress Notes (Signed)
S/p lap RIH  Doing very well Very little pain + PO  PE NAD Abd: soft, incisions c/d/i, no recurrence  A/P Doing well Rtc prn No heavy lifting

## 2016-12-24 NOTE — Patient Instructions (Signed)

## 2017-02-19 ENCOUNTER — Other Ambulatory Visit: Payer: Self-pay | Admitting: Otolaryngology

## 2017-02-19 DIAGNOSIS — H903 Sensorineural hearing loss, bilateral: Secondary | ICD-10-CM

## 2017-02-25 ENCOUNTER — Other Ambulatory Visit
Admission: RE | Admit: 2017-02-25 | Discharge: 2017-02-25 | Disposition: A | Discharge status: Home / Self Care | Payer: Medicare Other | Source: Ambulatory Visit | Attending: Otolaryngology | Admitting: Otolaryngology

## 2017-02-25 ENCOUNTER — Encounter (INDEPENDENT_AMBULATORY_CARE_PROVIDER_SITE_OTHER): Payer: Self-pay

## 2017-02-25 ENCOUNTER — Ambulatory Visit
Admission: RE | Admit: 2017-02-25 | Discharge: 2017-02-25 | Disposition: A | Discharge status: Home / Self Care | Payer: Medicare Other | Source: Ambulatory Visit | Attending: Otolaryngology | Admitting: Otolaryngology

## 2017-02-25 DIAGNOSIS — I6782 Cerebral ischemia: Secondary | ICD-10-CM | POA: Insufficient documentation

## 2017-02-25 DIAGNOSIS — H903 Sensorineural hearing loss, bilateral: Secondary | ICD-10-CM

## 2017-02-25 LAB — BUN: BUN: 19 mg/dL (ref 6–20)

## 2017-02-25 LAB — CREATININE, SERUM: Creatinine, Ser: 0.9 mg/dL (ref 0.61–1.24)

## 2017-02-25 MED ORDER — GADOBENATE DIMEGLUMINE 529 MG/ML IV SOLN
20.0000 mL | Freq: Once | INTRAVENOUS | Status: AC | PRN
  Administered 2017-02-25: 18 mL via INTRAVENOUS

## 2018-02-01 IMAGING — MR MR BRAIN/IAC WO/W
11 of 12 series · 41 of 48 positions shown · IV contrast (18 ML MULTIHANCE)
Comparison: CT 04/23/2010.  MRI 08/26/2009.

CLINICAL DATA: Tinnitus on the right. Loss of hearing on the right,
5 months duration.

EXAM:
MRI HEAD WITHOUT AND WITH CONTRAST
TECHNIQUE: Multiplanar, multiecho pulse sequences of the brain and surrounding
structures were obtained without and with intravenous contrast.
CONTRAST:  18mL MULTIHANCE GADOBENATE DIMEGLUMINE 529 MG/ML IV SOLN

[Series 3: DWI · axial · 3.0mm · 1.20mm/px · z∈[-65,+120]mm · 8 of 63 slices shown (1 of 2)]
[im 1/63]
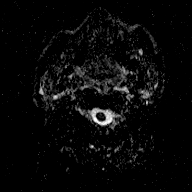
[im 9/63]
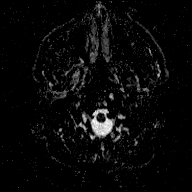
[im 18/63]
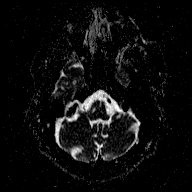
[im 27/63]
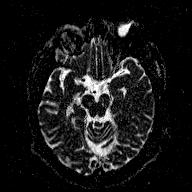
[im 36/63]
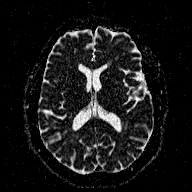
[im 45/63]
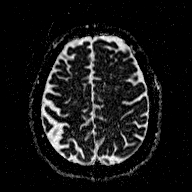
[im 54/63]
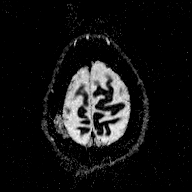
[im 63/63]
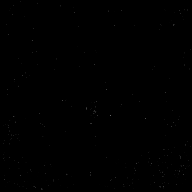

[Series 4: T1 · sagittal · 5.0mm · 0.45mm/px · 3 of 29 slices shown (1 of 3)]
[im 1/29]
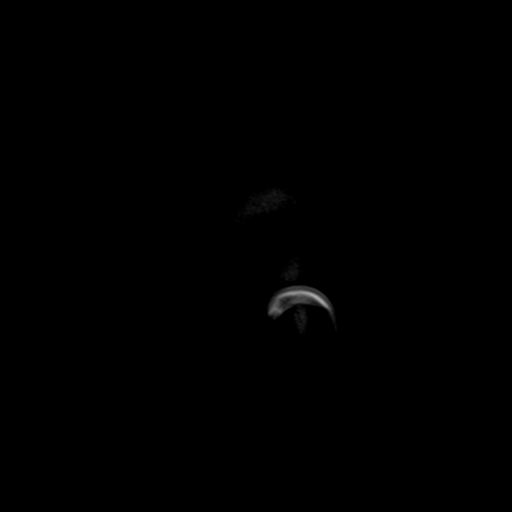
[im 15/29]
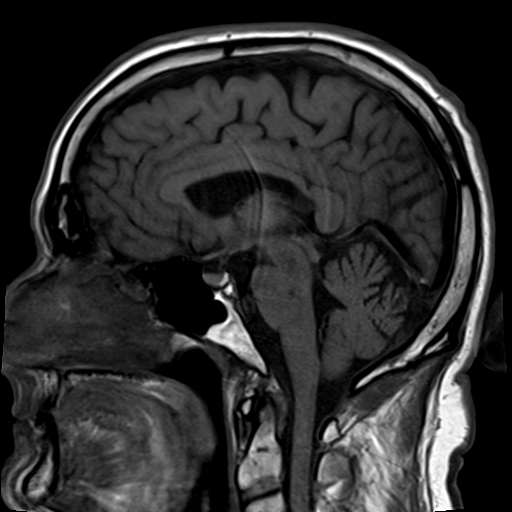
[im 29/29]
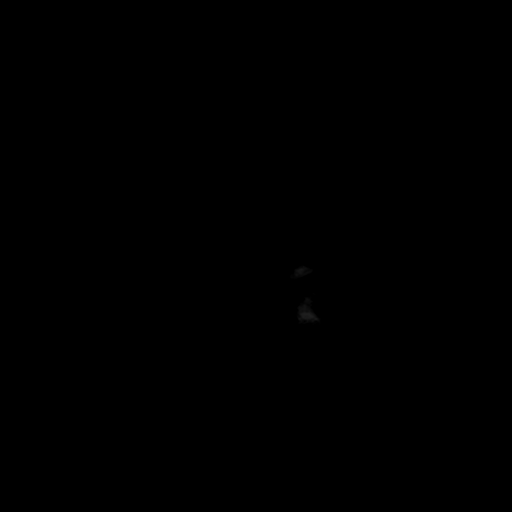

[Series 5: T2 · axial · 5.0mm · 0.72mm/px · z∈[-69,+125]mm · 4 of 31 slices shown (1 of 2)]
[im 1/31]
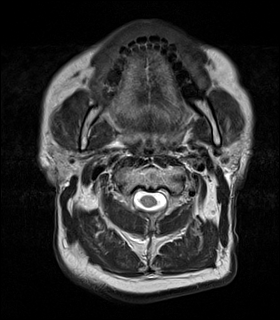
[im 11/31]
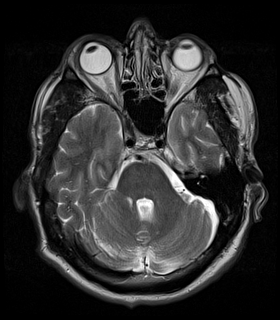
[im 21/31]
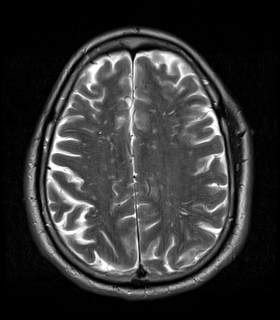
[im 31/31]
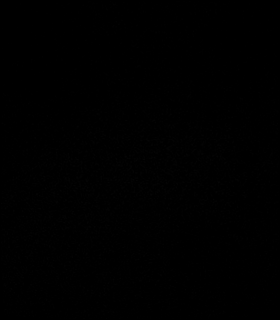

[Series 6: T2 · axial · 5.0mm · 0.72mm/px · z∈[-69,+125]mm · 4 of 31 slices shown (2 of 2)]
[im 1/31]
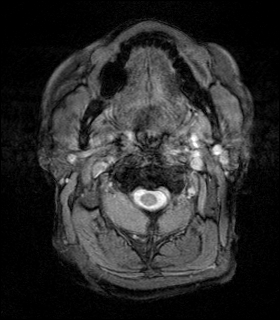
[im 11/31]
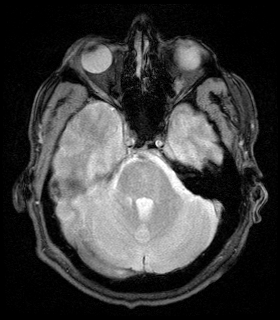
[im 21/31]
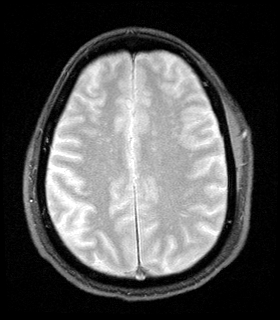
[im 31/31]
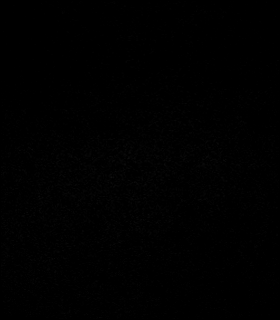

[Series 7: FLAIR · axial · 5.0mm · 0.45mm/px · z∈[-69,+125]mm · 4 of 31 slices shown]
[im 1/31]
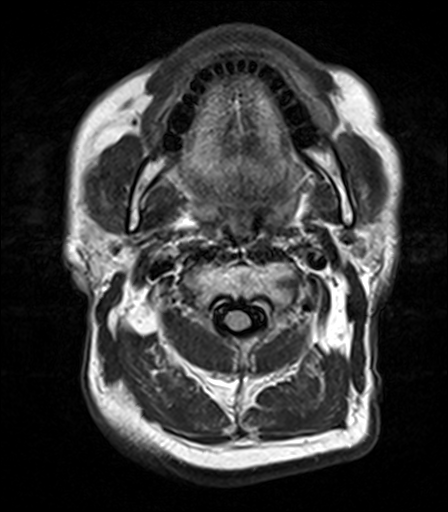
[im 11/31]
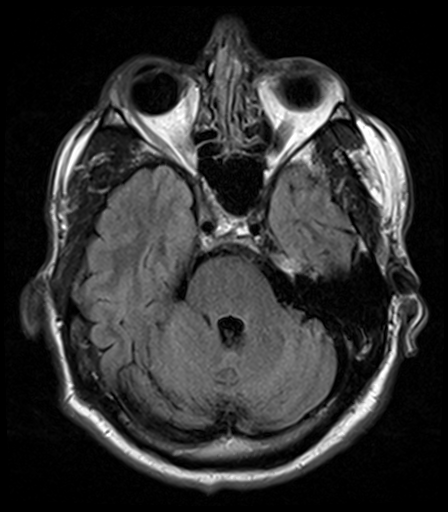
[im 21/31]
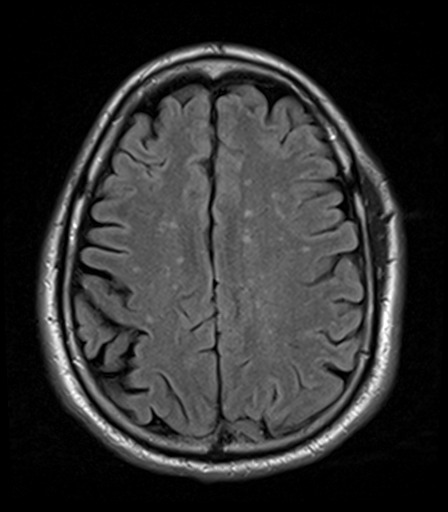
[im 31/31]
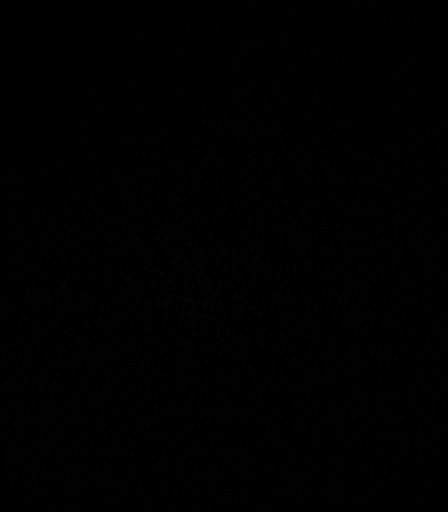

[Series 8: T1 · coronal · 3.0mm · 0.37mm/px · 1 of 11 slices shown (2 of 3)]
[im 1/11]
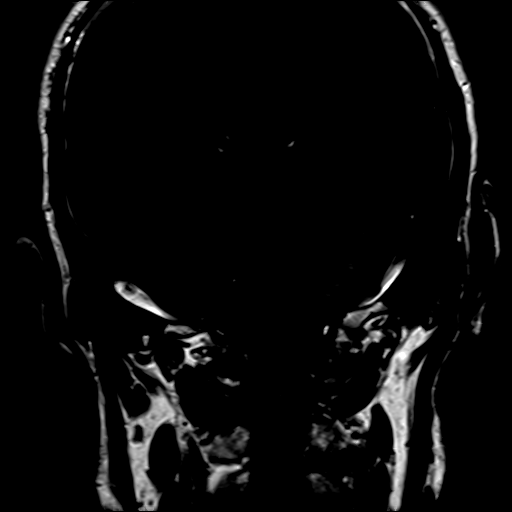

[Series 10: T1 · axial · 3.0mm · 0.37mm/px · 1 of 11 slices shown (3 of 3)]
[im 1/11]
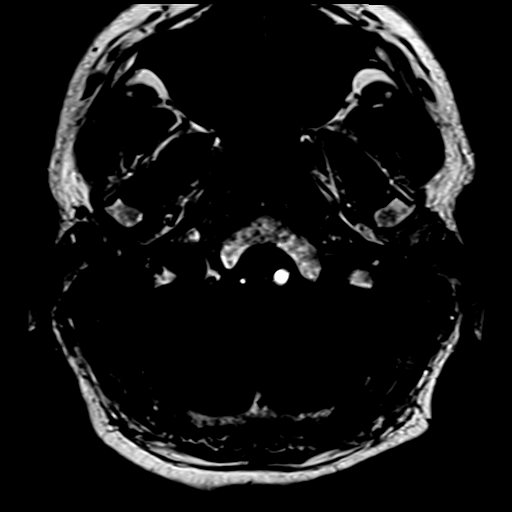

[Series 11: T1 post-contrast · axial · 3.0mm · 0.37mm/px · 1 of 11 slices shown (1 of 3)]
[im 1/11]
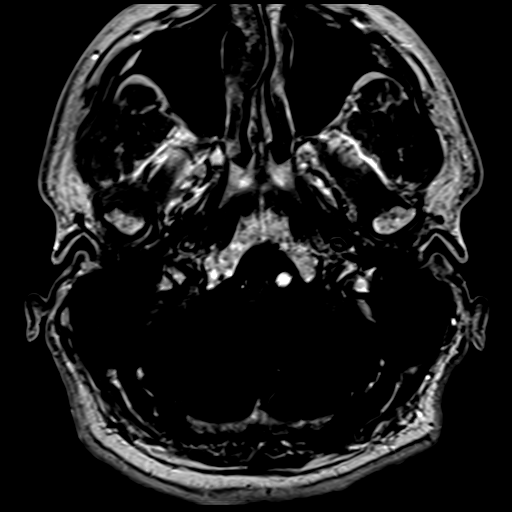

[Series 12: T1 post-contrast · coronal · 3.0mm · 0.37mm/px · 1 of 11 slices shown (2 of 3)]
[im 1/11]
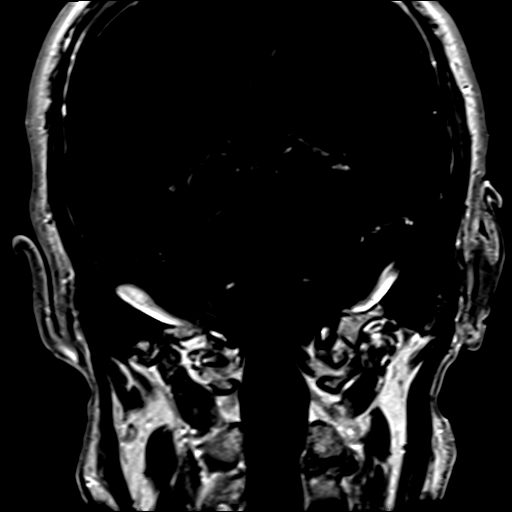

[Series 13: T1 post-contrast · axial · 3.0mm · 1.00mm/px · z∈[-65,+123]mm · 7 of 64 slices shown (3 of 3)]
[im 1/64]
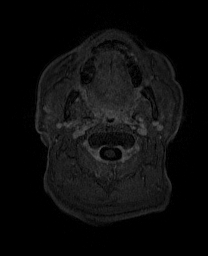
[im 11/64]
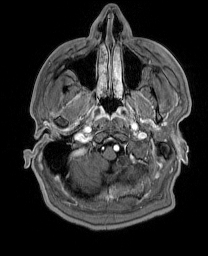
[im 22/64]
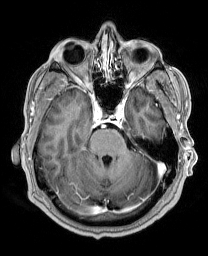
[im 32/64]
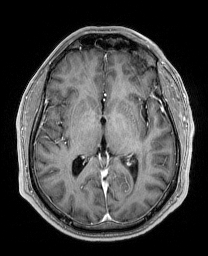
[im 43/64]
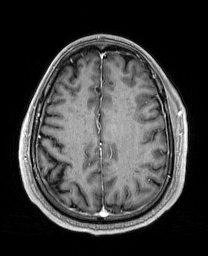
[im 53/64]
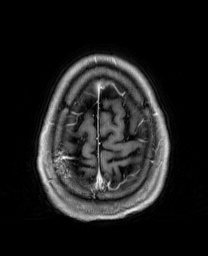
[im 64/64]
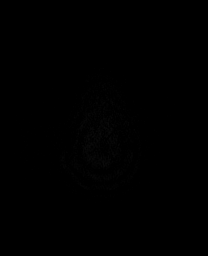

[Series 100: DWI · axial · 3.0mm · 1.20mm/px · z∈[-65,+120]mm · 7 of 63 slices shown (2 of 2)]
[im 1/63]
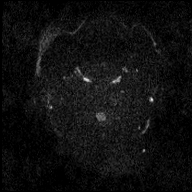
[im 11/63]
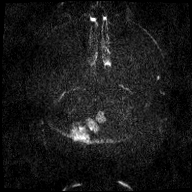
[im 21/63]
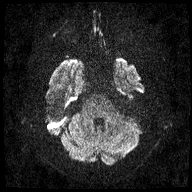
[im 32/63]
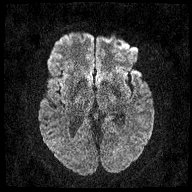
[im 42/63]
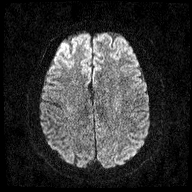
[im 52/63]
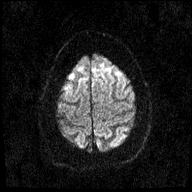
[im 63/63]
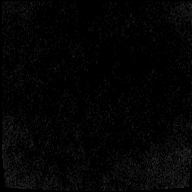

[41 of 48 positions shown; findings below may reference images not displayed]

FINDINGS: Brain: Diffusion imaging does not show any acute or subacute
infarction. The brainstem and cerebellum are normal. CP angle
regions are normal. No vestibular schwannoma or enhancing neuritis.
Cerebral hemispheres show mild to moderate chronic small-vessel
ischemic changes of the deep and subcortical white matter. No
cortical or large vessel territory infarction. No mass lesion,
hemorrhage, hydrocephalus or extra-axial collection. No abnormal
contrast enhancement.

Vascular: Major vessels at the base of the brain show flow.

Skull and upper cervical spine: Benign appearing hemangioma of the
right parietal calvarium, unchanged since 9688.

Sinuses/Orbits: Clear/normal

Other: None
IMPRESSION: No cause of the presenting symptoms is identified. No vestibular
schwannoma or enhancing neuritis. No fluid in the middle ear or
mastoid air cells.

Mild to moderate chronic small-vessel ischemic changes of the
hemispheric white matter.

## 2018-10-15 ENCOUNTER — Other Ambulatory Visit: Payer: Self-pay

## 2018-10-15 DIAGNOSIS — Z20822 Contact with and (suspected) exposure to covid-19: Secondary | ICD-10-CM

## 2018-10-16 LAB — NOVEL CORONAVIRUS, NAA: SARS-CoV-2, NAA: NOT DETECTED

## 2020-05-20 ENCOUNTER — Other Ambulatory Visit: Payer: Self-pay | Admitting: Family Medicine

## 2020-05-20 DIAGNOSIS — Z136 Encounter for screening for cardiovascular disorders: Secondary | ICD-10-CM

## 2020-05-20 DIAGNOSIS — I6523 Occlusion and stenosis of bilateral carotid arteries: Secondary | ICD-10-CM

## 2020-06-10 ENCOUNTER — Ambulatory Visit
Admission: RE | Admit: 2020-06-10 | Discharge: 2020-06-10 | Disposition: A | Discharge status: Home / Self Care | Payer: Medicare Other | Source: Ambulatory Visit | Attending: Family Medicine | Admitting: Family Medicine

## 2020-06-10 DIAGNOSIS — Z136 Encounter for screening for cardiovascular disorders: Secondary | ICD-10-CM

## 2020-06-10 DIAGNOSIS — I6523 Occlusion and stenosis of bilateral carotid arteries: Secondary | ICD-10-CM

## 2021-05-17 IMAGING — US US CAROTID DUPLEX BILAT
1 series · 13 of 24 positions shown · non-contrast
Comparison: [DATE]

CLINICAL DATA: Carotid atherosclerosis

EXAM:
BILATERAL CAROTID DUPLEX ULTRASOUND
TECHNIQUE: Gray scale imaging, color Doppler and duplex ultrasound were
performed of bilateral carotid and vertebral arteries in the neck.

[Series 1: us carotid bilateral · 13 of 63 slices shown]
[im 1/63]
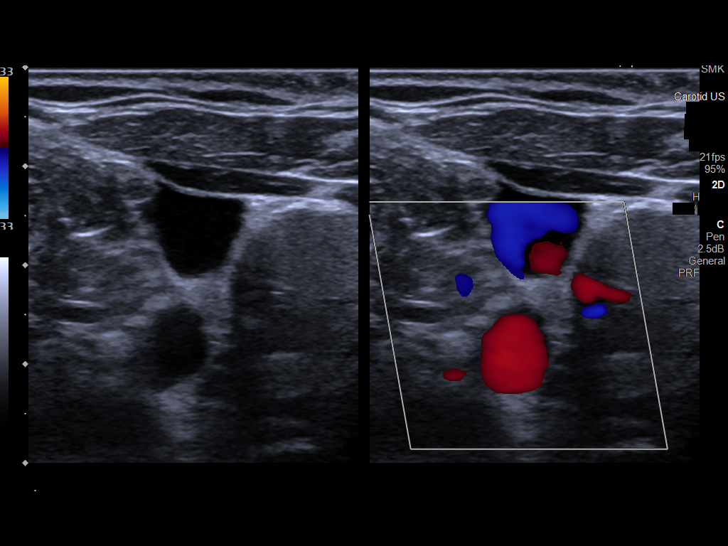
[im 6/63]
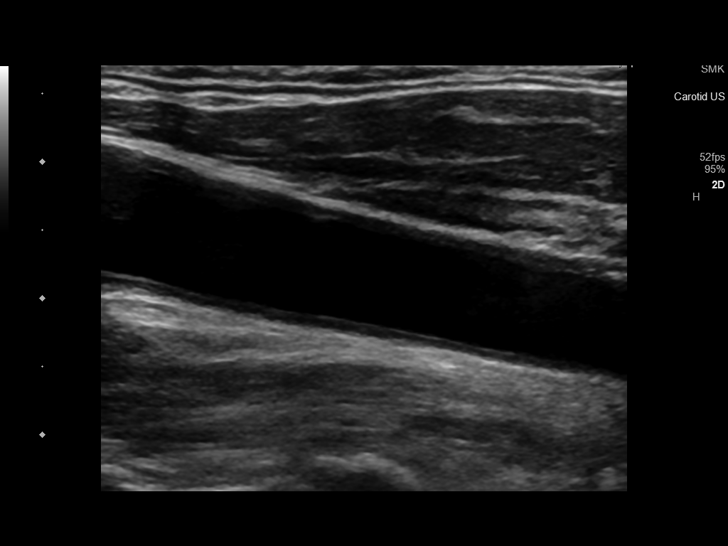
[im 11/63]
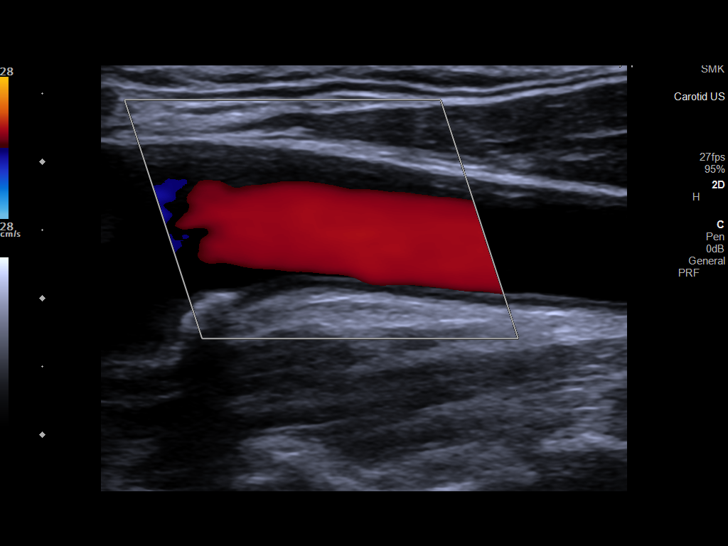
[im 17/63]
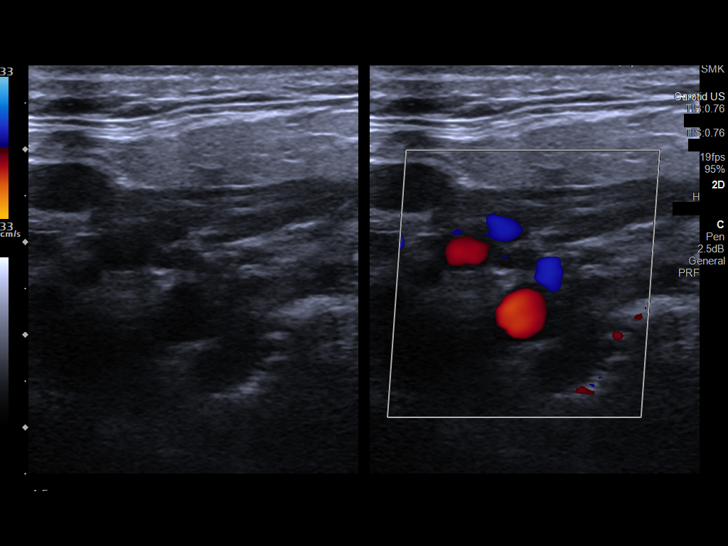
[im 22/63]
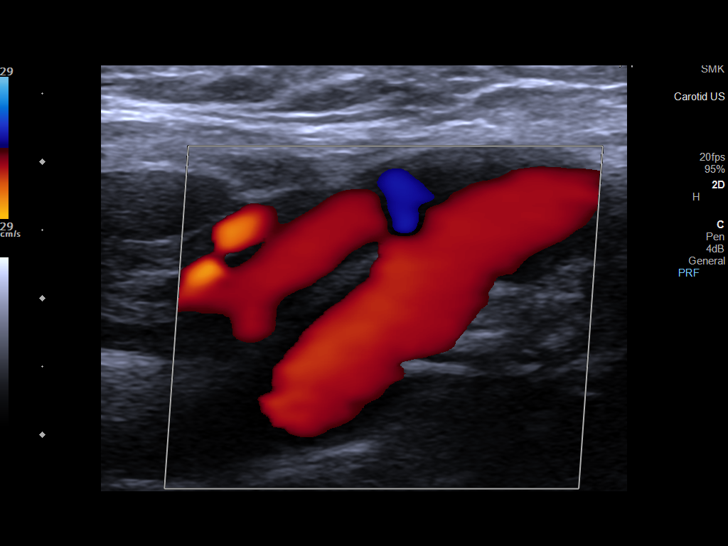
[im 27/63]
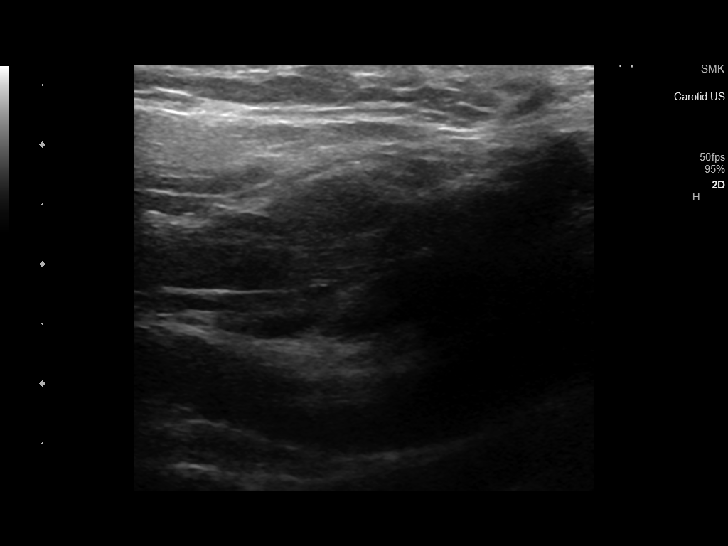
[im 33/63]
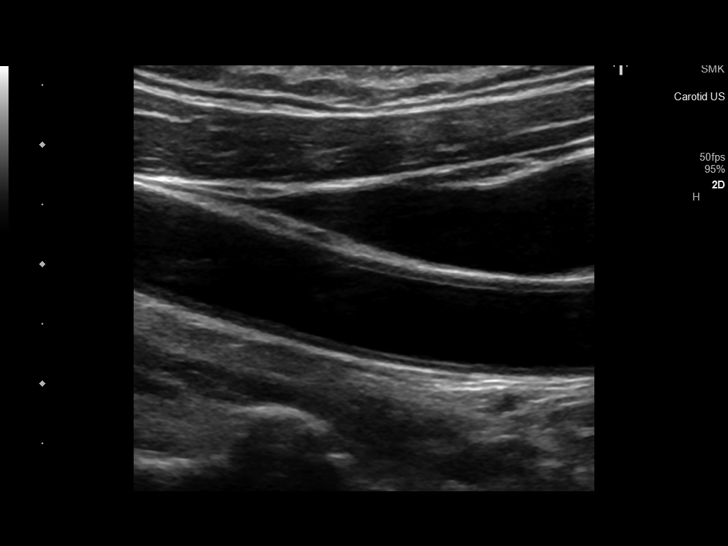
[im 36/63]
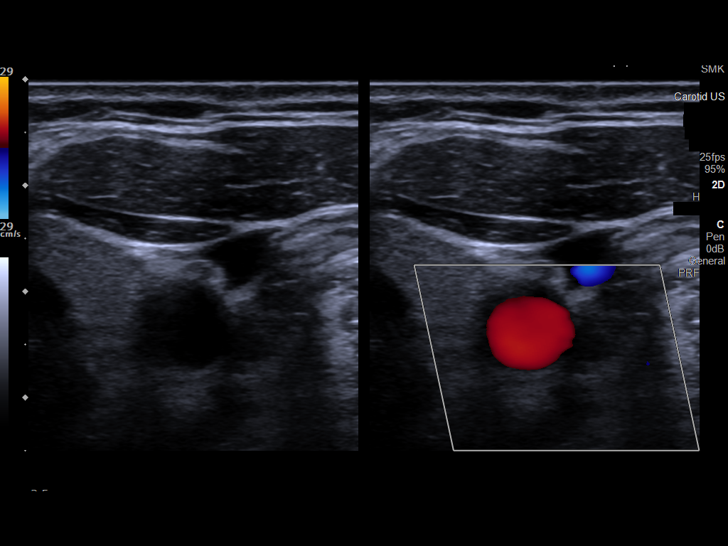
[im 41/63]
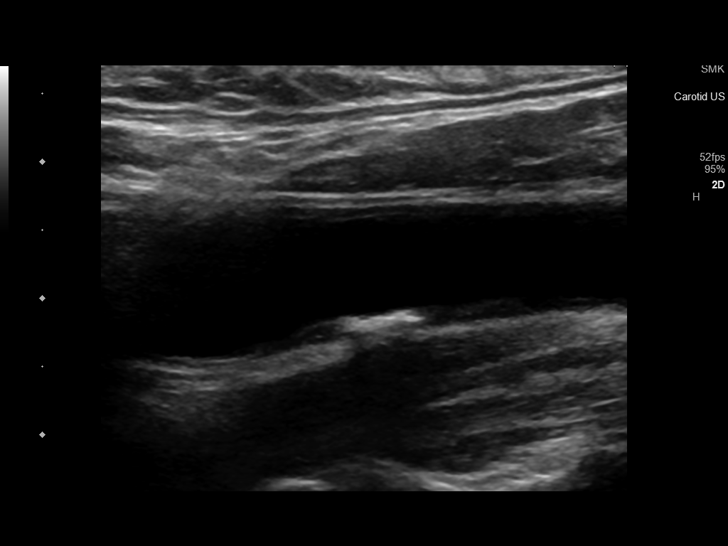
[im 46/63]
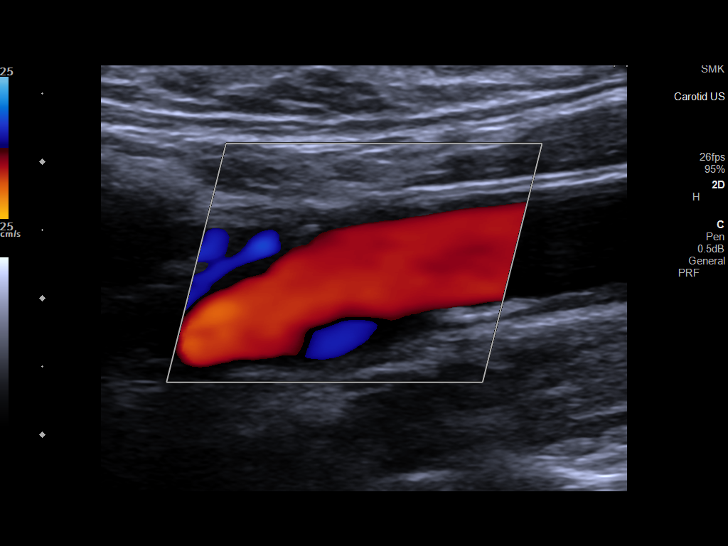
[im 52/63]
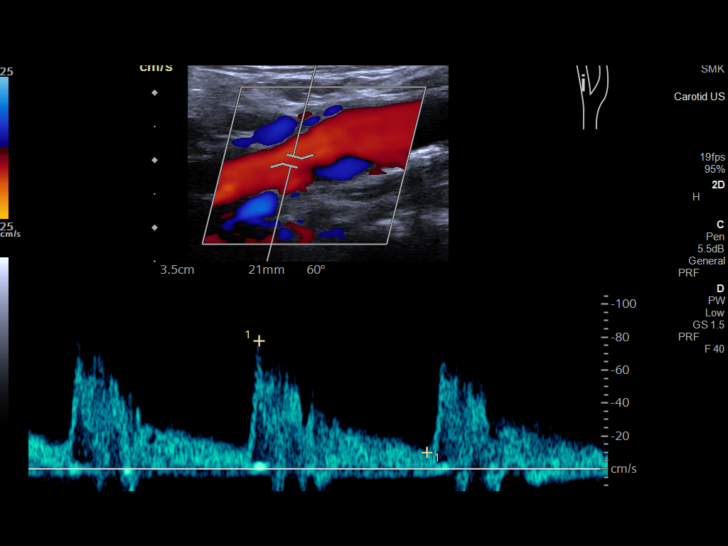
[im 57/63]
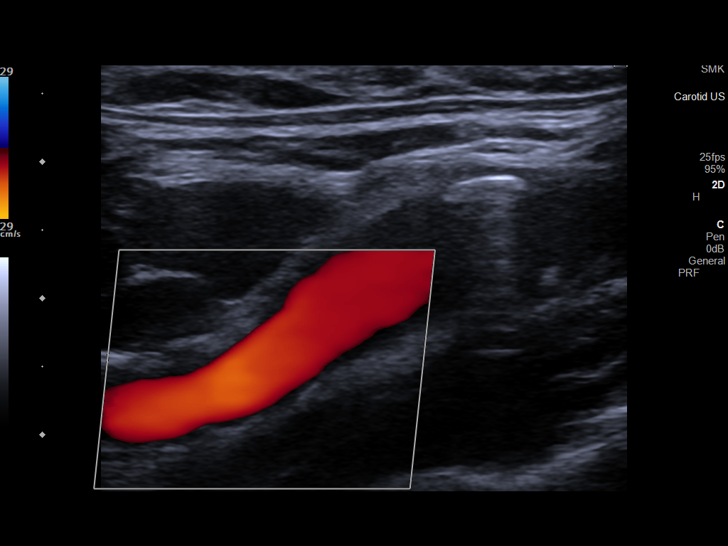
[im 63/63]
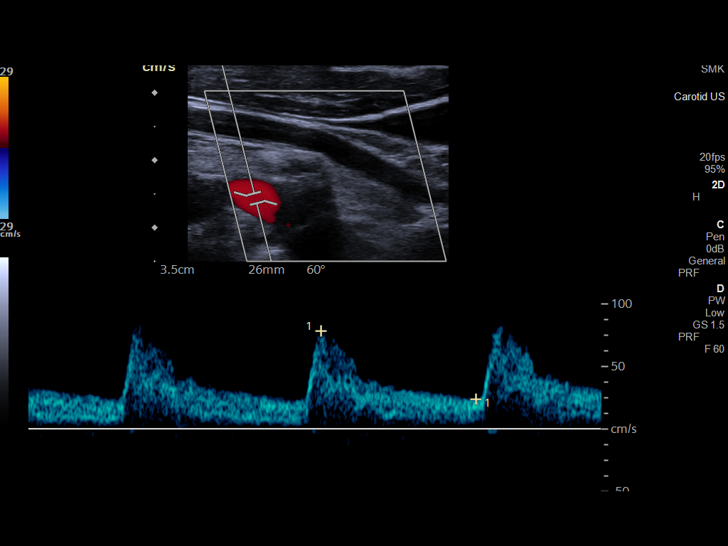

[13 of 24 positions shown; findings below may reference images not displayed]

FINDINGS: Criteria: Quantification of carotid stenosis is based on velocity
parameters that correlate the residual internal carotid diameter
with NASCET-based stenosis levels, using the diameter of the distal
internal carotid lumen as the denominator for stenosis measurement.

The following velocity measurements were obtained:

RIGHT

ICA: 83/15 cm/sec

CCA: 82/17 cm/sec

SYSTOLIC ICA/CCA RATIO:

ECA: 112 cm/sec

LEFT

ICA: 88/28 cm/sec

CCA: 85/17 cm/sec

SYSTOLIC ICA/CCA RATIO:

ECA: 78 cm/sec

RIGHT CAROTID ARTERY: Mild atherosclerotic calcifications of the
carotid bulb without flow-limiting stenosis.

RIGHT VERTEBRAL ARTERY:  Antegrade flow.

LEFT CAROTID ARTERY: Mild atherosclerotic calcifications of the
carotid bulb without significant stenosis.

LEFT VERTEBRAL ARTERY:  Antegrade flow.
IMPRESSION: Mild atherosclerotic calcifications of the internal carotid artery
with velocity parameters indicating less than 50% stenosis.

## 2022-04-13 ENCOUNTER — Ambulatory Visit: Admit: 2022-04-13 | Payer: Medicare Other

## 2022-04-13 SURGERY — COLONOSCOPY WITH PROPOFOL
Anesthesia: General

## 2022-06-23 ENCOUNTER — Ambulatory Visit: Admit: 2022-06-23 | Payer: Medicare Other

## 2022-06-23 SURGERY — COLONOSCOPY WITH PROPOFOL
Anesthesia: General

## 2022-09-02 ENCOUNTER — Other Ambulatory Visit: Payer: Self-pay

## 2022-09-02 ENCOUNTER — Ambulatory Visit (INDEPENDENT_AMBULATORY_CARE_PROVIDER_SITE_OTHER)
Admission: EM | Admit: 2022-09-02 | Discharge: 2022-09-02 | Disposition: A | Discharge status: Home / Self Care | Payer: Medicare Other | Source: Home / Self Care

## 2022-09-02 ENCOUNTER — Inpatient Hospital Stay
Admission: EM | Admit: 2022-09-02 | Discharge: 2022-09-04 | DRG: 872 | Disposition: A | Discharge status: Home / Self Care | Payer: Medicare Other | Attending: Internal Medicine | Admitting: Internal Medicine

## 2022-09-02 DIAGNOSIS — G44031 Episodic paroxysmal hemicrania, intractable: Secondary | ICD-10-CM | POA: Diagnosis present

## 2022-09-02 DIAGNOSIS — T502X5A Adverse effect of carbonic-anhydrase inhibitors, benzothiadiazides and other diuretics, initial encounter: Secondary | ICD-10-CM | POA: Diagnosis present

## 2022-09-02 DIAGNOSIS — M19041 Primary osteoarthritis, right hand: Secondary | ICD-10-CM | POA: Diagnosis present

## 2022-09-02 DIAGNOSIS — I10 Essential (primary) hypertension: Secondary | ICD-10-CM | POA: Diagnosis present

## 2022-09-02 DIAGNOSIS — Z8249 Family history of ischemic heart disease and other diseases of the circulatory system: Secondary | ICD-10-CM | POA: Diagnosis not present

## 2022-09-02 DIAGNOSIS — Z791 Long term (current) use of non-steroidal anti-inflammatories (NSAID): Secondary | ICD-10-CM | POA: Diagnosis not present

## 2022-09-02 DIAGNOSIS — N3000 Acute cystitis without hematuria: Secondary | ICD-10-CM | POA: Diagnosis not present

## 2022-09-02 DIAGNOSIS — R55 Syncope and collapse: Secondary | ICD-10-CM | POA: Diagnosis present

## 2022-09-02 DIAGNOSIS — E785 Hyperlipidemia, unspecified: Secondary | ICD-10-CM | POA: Diagnosis present

## 2022-09-02 DIAGNOSIS — M1990 Unspecified osteoarthritis, unspecified site: Secondary | ICD-10-CM | POA: Insufficient documentation

## 2022-09-02 DIAGNOSIS — Z79899 Other long term (current) drug therapy: Secondary | ICD-10-CM | POA: Diagnosis not present

## 2022-09-02 DIAGNOSIS — Z87891 Personal history of nicotine dependence: Secondary | ICD-10-CM

## 2022-09-02 DIAGNOSIS — D649 Anemia, unspecified: Secondary | ICD-10-CM | POA: Diagnosis present

## 2022-09-02 DIAGNOSIS — N3090 Cystitis, unspecified without hematuria: Secondary | ICD-10-CM | POA: Diagnosis present

## 2022-09-02 DIAGNOSIS — B9689 Other specified bacterial agents as the cause of diseases classified elsewhere: Secondary | ICD-10-CM | POA: Diagnosis present

## 2022-09-02 DIAGNOSIS — A419 Sepsis, unspecified organism: Secondary | ICD-10-CM | POA: Diagnosis present

## 2022-09-02 DIAGNOSIS — E876 Hypokalemia: Secondary | ICD-10-CM | POA: Diagnosis present

## 2022-09-02 DIAGNOSIS — N390 Urinary tract infection, site not specified: Secondary | ICD-10-CM

## 2022-09-02 DIAGNOSIS — W19XXXA Unspecified fall, initial encounter: Secondary | ICD-10-CM | POA: Diagnosis present

## 2022-09-02 LAB — COMPREHENSIVE METABOLIC PANEL
ALT: 21 U/L (ref 0–44)
AST: 26 U/L (ref 15–41)
Albumin: 4.2 g/dL (ref 3.5–5.0)
Alkaline Phosphatase: 58 U/L (ref 38–126)
Anion gap: 10 (ref 5–15)
BUN: 26 mg/dL — ABNORMAL HIGH (ref 8–23)
CO2: 23 mmol/L (ref 22–32)
Calcium: 8.8 mg/dL — ABNORMAL LOW (ref 8.9–10.3)
Chloride: 99 mmol/L (ref 98–111)
Creatinine, Ser: 1.12 mg/dL (ref 0.61–1.24)
GFR, Estimated: >60 mL/min (ref >60)
Glucose, Bld: 163 mg/dL — ABNORMAL HIGH (ref 70–99)
Potassium: 2.6 mmol/L — CL (ref 3.5–5.1)
Sodium: 132 mmol/L — ABNORMAL LOW (ref 135–145)
Total Bilirubin: 1.2 mg/dL (ref 0.3–1.2)
Total Protein: 7.2 g/dL (ref 6.5–8.1)

## 2022-09-02 LAB — CBC WITH DIFFERENTIAL/PLATELET
Abs Immature Granulocytes: 0.09 10*3/uL — ABNORMAL HIGH (ref 0.00–0.07)
Basophils Absolute: 0.1 10*3/uL (ref 0.0–0.1)
Basophils Relative: 0 %
Eosinophils Absolute: 0 10*3/uL (ref 0.0–0.5)
Eosinophils Relative: 0 %
HCT: 37.7 % — ABNORMAL LOW (ref 39.0–52.0)
Hemoglobin: 12.8 g/dL — ABNORMAL LOW (ref 13.0–17.0)
Immature Granulocytes: 1 %
Lymphocytes Relative: 5 %
Lymphs Abs: 0.6 10*3/uL — ABNORMAL LOW (ref 0.7–4.0)
MCH: 30.8 pg (ref 26.0–34.0)
MCHC: 34 g/dL (ref 30.0–36.0)
MCV: 90.8 fL (ref 80.0–100.0)
Monocytes Absolute: 1 10*3/uL (ref 0.1–1.0)
Monocytes Relative: 8 %
Neutro Abs: 11.8 10*3/uL — ABNORMAL HIGH (ref 1.7–7.7)
Neutrophils Relative %: 86 %
Platelets: 197 10*3/uL (ref 150–400)
RBC: 4.15 MIL/uL — ABNORMAL LOW (ref 4.22–5.81)
RDW: 12.4 % (ref 11.5–15.5)
WBC: 13.6 10*3/uL — ABNORMAL HIGH (ref 4.0–10.5)
nRBC: 0 % (ref 0.0–0.2)

## 2022-09-02 LAB — URINALYSIS, W/ REFLEX TO CULTURE (INFECTION SUSPECTED)
Glucose, UA: NEGATIVE mg/dL
Nitrite: POSITIVE — AB
Protein, ur: 100 mg/dL — AB
RBC / HPF: >50 RBC/hpf (ref 0–5)
Specific Gravity, Urine: 1.025 (ref 1.005–1.030)
pH: 6.5 (ref 5.0–8.0)

## 2022-09-02 LAB — LACTIC ACID, PLASMA: Lactic Acid, Venous: 1.4 mmol/L (ref 0.5–1.9)

## 2022-09-02 MED ORDER — ROSUVASTATIN CALCIUM 10 MG PO TABS
5.0000 mg | ORAL_TABLET | Freq: Every day | ORAL | Status: DC
  Administered 2022-09-03 – 2022-09-04 (×2): 5 mg via ORAL
  Filled 2022-09-02 (×2): qty 1

## 2022-09-02 MED ORDER — SODIUM CHLORIDE 0.9 % IV SOLN: 2.0000 g | INTRAVENOUS | Status: DC

## 2022-09-02 MED ORDER — LIFITEGRAST 5 % OP SOLN: 1.0000 [drp] | Freq: Two times a day (BID) | OPHTHALMIC | Status: DC

## 2022-09-02 MED ORDER — LACTATED RINGERS IV SOLN
150.0000 mL/h | INTRAVENOUS | Status: AC
  Administered 2022-09-03 (×2): 150 mL/h via INTRAVENOUS

## 2022-09-02 MED ORDER — CEPHALEXIN 500 MG PO CAPS: 500.0000 mg | ORAL_CAPSULE | Freq: Two times a day (BID) | ORAL | 0 refills | Status: DC

## 2022-09-02 MED ORDER — SODIUM CHLORIDE 0.9 % IV SOLN
2.0000 g | Freq: Once | INTRAVENOUS | Status: AC
  Administered 2022-09-02: 2 g via INTRAVENOUS
  Filled 2022-09-02: qty 20

## 2022-09-02 MED ORDER — ACETAMINOPHEN 650 MG RE SUPP: 650.0000 mg | Freq: Four times a day (QID) | RECTAL | Status: DC | PRN

## 2022-09-02 MED ORDER — GABAPENTIN 300 MG PO CAPS
600.0000 mg | ORAL_CAPSULE | Freq: Two times a day (BID) | ORAL | Status: DC
  Administered 2022-09-03 – 2022-09-04 (×4): 600 mg via ORAL
  Filled 2022-09-02 (×5): qty 2

## 2022-09-02 MED ORDER — HYDROCODONE-ACETAMINOPHEN 5-325 MG PO TABS: 1.0000 | ORAL_TABLET | ORAL | Status: DC | PRN

## 2022-09-02 MED ORDER — PHENAZOPYRIDINE HCL 200 MG PO TABS
200.0000 mg | ORAL_TABLET | Freq: Three times a day (TID) | ORAL | Status: DC
  Administered 2022-09-03: 200 mg via ORAL
  Filled 2022-09-02 (×3): qty 1

## 2022-09-02 MED ORDER — VERAPAMIL HCL ER 180 MG PO TBCR
180.0000 mg | EXTENDED_RELEASE_TABLET | Freq: Every day | ORAL | Status: DC
  Administered 2022-09-03: 180 mg via ORAL
  Filled 2022-09-02 (×2): qty 1

## 2022-09-02 MED ORDER — ACETAMINOPHEN 325 MG PO TABS
650.0000 mg | ORAL_TABLET | Freq: Four times a day (QID) | ORAL | Status: DC | PRN
  Administered 2022-09-03: 650 mg via ORAL
  Filled 2022-09-02: qty 2

## 2022-09-02 MED ORDER — ENOXAPARIN SODIUM 60 MG/0.6ML IJ SOSY
0.5000 mg/kg | PREFILLED_SYRINGE | INTRAMUSCULAR | Status: DC
  Administered 2022-09-03 (×2): 45 mg via SUBCUTANEOUS
  Filled 2022-09-02 (×2): qty 0.6

## 2022-09-02 MED ORDER — ONDANSETRON HCL 4 MG PO TABS: 4.0000 mg | ORAL_TABLET | Freq: Four times a day (QID) | ORAL | Status: DC | PRN

## 2022-09-02 MED ORDER — POTASSIUM CHLORIDE CRYS ER 20 MEQ PO TBCR
60.0000 meq | EXTENDED_RELEASE_TABLET | Freq: Once | ORAL | Status: AC
  Administered 2022-09-02: 60 meq via ORAL
  Filled 2022-09-02: qty 3

## 2022-09-02 MED ORDER — SODIUM CHLORIDE 0.9 % IV BOLUS
1000.0000 mL | Freq: Once | INTRAVENOUS | Status: AC
  Administered 2022-09-02: 1000 mL via INTRAVENOUS

## 2022-09-02 MED ORDER — ONDANSETRON HCL 4 MG/2ML IJ SOLN: 4.0000 mg | Freq: Four times a day (QID) | INTRAMUSCULAR | Status: DC | PRN

## 2022-09-02 MED ORDER — MORPHINE SULFATE (PF) 2 MG/ML IV SOLN: 2.0000 mg | INTRAVENOUS | Status: DC | PRN

## 2022-09-02 MED ORDER — PHENAZOPYRIDINE HCL 200 MG PO TABS: 200.0000 mg | ORAL_TABLET | Freq: Three times a day (TID) | ORAL | 0 refills | Status: AC

## 2022-09-02 NOTE — Assessment & Plan Note (Addendum)
Possible acute metabolic encephalopathy related to sepsis Secondary to weakness related to sepsis Continuous cardiac monitoring Deferring additional syncope workup for now given suspicion is that this is related to sepsis

## 2022-09-02 NOTE — ED Provider Notes (Signed)
[facility]    CSN: [id number] Arrival date & time: 09/02/22  1226      History   Chief Complaint Chief Complaint  Patient presents with   Dysuria   Fever    HPI Clayton Reyes is a 77 y.o. male.   HPI  77 year old male with a past medical history significant for headache, hypertension, arthritis, and anemia presents for evaluation of urinary symptoms which started yesterday afternoon.  He describes burning with urination along with increased urgency and frequency.  He has had an increase in nocturia as well and is endorsing low back pain.  He denies any abdominal pain or blood in his urine.  He does report that his urine appeared very cloudy.  He does not have a history of previous UTIs and he denies any recent catheterizations or surgical procedures.  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    Past Surgical History:  Procedure Laterality Date   COLONOSCOPY WITH PROPOFOL N/A 04/09/2016   Procedure: COLONOSCOPY WITH PROPOFOL;  Surgeon: Christena Deem, MD;  Location: [facility] ENDOSCOPY;  Service: Endoscopy;  Laterality: N/A;   CYST REMOVAL HAND Left    CYST REMOVAL NECK Right    HERNIA REPAIR N/A 2014   INGUINAL HERNIA REPAIR Right 12/16/2016   Procedure: LAPAROSCOPIC INGUINAL HERNIA Repair;  Surgeon: Leafy Ro, MD;  Location: [facility] ORS;  Service: General;  Laterality: Right;   LUMBAR LAMINECTOMY/DECOMPRESSION MICRODISCECTOMY Right 06/06/2012   Procedure: LUMBAR LAMINECTOMY/DECOMPRESSION MICRODISCECTOMY 1 LEVEL;  Surgeon: Mariam Dollar, MD;  Location: [facility] NEURO ORS;  Service: Neurosurgery;  Laterality: Right;  Right Lumbar  Three-Four Decompressive Laminectomy and Microdiskectomy   ORIF ORBITAL FRACTURE Left 1971       Home Medications    Prior to Admission medications   Medication Sig Start Date End Date Taking? Authorizing Provider  cephALEXin (KEFLEX) 500 MG capsule Take 1 capsule (500 mg total) by mouth 2 (two) times daily for 10 days. 09/02/22 09/12/22 Yes Becky Augusta, NP  gabapentin (NEURONTIN) 600 MG tablet Take 600-1,200 mg by mouth 2 (two) times daily. Takes 1 tablet (600 mg) in the morning and 2 tablets (1200 mg) in the evening . 11/17/16  Yes [provider]  hypromellose (SYSTANE OVERNIGHT THERAPY) 0.3 % GEL ophthalmic ointment Place 1 application into both eyes at bedtime.   Yes [provider]  Lifitegrast Benay Spice) 5 % SOLN Apply 1 drop to eye 2 (two) times daily.    Yes [provider]  losartan-hydrochlorothiazide (HYZAAR) 100-25 MG per tablet Take 1 tablet by mouth daily.   Yes [provider]  meloxicam (MOBIC) 15 MG tablet Take 15 mg by mouth daily.    Yes [provider]  Omega-3 Fatty Acids (FISH OIL) 1000 MG CAPS Take 1,000 mg by mouth daily.   Yes [provider]  phenazopyridine (PYRIDIUM) 200 MG tablet Take 1 tablet (200 mg total) by mouth 3 (three) times daily. 09/02/22  Yes Becky Augusta, NP  Polyethyl Glycol-Propyl Glycol (SYSTANE ULTRA OP) Apply 1 drop to eye as needed (dry eyes).   Yes [provider]  verapamil (CALAN-SR) 180 MG CR tablet  Take 180 mg by mouth at bedtime.   Yes [provider]    Family History Family History  Problem Relation Age of Onset   Diabetes Mother    Stroke Mother    Parkinson's disease Father    Heart disease Father    Stroke Sister    Diabetes Sister     Social History Social History   Tobacco Use   Smoking status: Former    Packs/day: 1.00    Years: 20.00    Additional pack years: 0.00    Total pack years: 20.00    Types: Cigarettes    Quit date: 12/08/1987    Years  since quitting: 34.7   Smokeless tobacco: Never  Vaping Use   Vaping Use: Never used  Substance Use Topics   Alcohol use: Yes    Comment: socially   Drug use: No     Allergies   Patient has no known allergies.   Review of Systems Review of Systems  Constitutional:  Positive for fever.  Gastrointestinal:  Negative for abdominal pain.  Genitourinary:  Positive for dysuria, frequency and urgency. Negative for hematuria.  Musculoskeletal:  Positive for back pain.     Physical Exam Triage Vital Signs ED Triage Vitals [09/02/22 1231]  Enc Vitals Group     BP      Pulse      Resp 16     Temp      Temp Source Oral     SpO2      Weight      Height      Head Circumference      Peak Flow      Pain Score      Pain Loc      Pain Edu?      Excl. in GC?    No data found.  Updated Vital Signs BP (!) 145/71 (BP Location: Left Arm)   Pulse (!) 103   Temp (!) 101.1 F (38.4 C) (Oral)   Resp 16   SpO2 95%   Visual Acuity Right Eye Distance:   Left Eye Distance:   Bilateral Distance:    Right Eye Near:   Left Eye Near:    Bilateral Near:     Physical Exam Vitals and nursing note reviewed.  Constitutional:      Appearance: Normal appearance. He is not ill-appearing.  HENT:     Head: Normocephalic and atraumatic.  Cardiovascular:     Rate and Rhythm: Normal rate and regular rhythm.     Pulses: Normal pulses.     Heart sounds: Normal heart sounds. No murmur heard.    No friction rub. No gallop.  Pulmonary:     Effort: Pulmonary effort is normal.     Breath sounds: Normal breath sounds. No wheezing, rhonchi or rales.  Abdominal:     Tenderness: There is no right CVA tenderness or left CVA tenderness.  Skin:    General: Skin is warm and dry.     Capillary Refill: Capillary refill takes less than 2 seconds.  Neurological:     General: No focal deficit present.     Mental Status: He is alert and oriented to person, place, and time.      UC Treatments /  Results  Labs (all labs ordered are listed, but only abnormal results are displayed) Labs Reviewed  URINALYSIS, W/ REFLEX TO CULTURE (INFECTION SUSPECTED) - Abnormal; Notable for the following components:      Result Value  APPearance CLOUDY (*)    Hgb urine dipstick LARGE (*)    Bilirubin Urine SMALL (*)    Ketones, ur TRACE (*)    Protein, ur 100 (*)    Nitrite POSITIVE (*)    Leukocytes,Ua SMALL (*)    Bacteria, UA MANY (*)    All other components within normal limits  URINE CULTURE    EKG   Radiology No results found.  Procedures Procedures (including critical care time)  Medications Ordered in UC Medications - No data to display  Initial Impression / Assessment and Plan / UC Course  I have reviewed the triage vital signs and the nursing notes.  Pertinent labs & imaging results that were available during my care of the patient were reviewed by me and considered in my medical decision making (see chart for details).   Patient is a pleasant, nontoxic-appearing 61 old male presenting for evaluation of UTI symptoms as outlined in HPI above.  His physical exam reveals a benign cardiopulmonary exam and no CVA tenderness.  No previous history of UTIs in the recent catheterizations.  He is febrile in clinic with a temperature of 101.2 and mildly tachycardic and 103.  He states that he drinks a fair amount of water for the day and does urinate frequently he does not ever hold his urine.  I will order a urinalysis to evaluate for the presence of UTI.  Urinalysis shows cloudy appearance with large hemoglobin, small bilirubin, trace ketones, 100 protein, nitrite positive with small leukocyte esterase.  Reflex microscopy shows 21-50 WBCs with greater than 50 RBCs, WBC clumps, and many bacteria.  Urine will reflex to culture.  I will discharge patient on the diagnosis of UTI and treat him with Keflex 500 mg twice daily for 10 days.  I will also prescribe Pyridium 200 mg that he can take  every 8 hours as needed for urinary discomfort.   Final Clinical Impressions(s) / UC Diagnoses   Final diagnoses:  Lower urinary tract infectious disease     Discharge Instructions      Take the Keflex twice daily for 10 days with food for treatment of urinary tract infection.  Use the Pyridium every 8 hours as needed for urinary discomfort.  This will turn your urine a bright red-orange.  Increase your oral fluid intake so that you increase your urine production and or flushing your urinary system.  Take an over-the-counter probiotic, such as Culturelle-Align-Activia, 1 hour after each dose of antibiotic to prevent diarrhea or yeast infections from forming.  We will culture urine and change the antibiotics if necessary.  Return for reevaluation, or see your primary care provider, for any new or worsening symptoms.      ED Prescriptions     Medication Sig Dispense Auth. Provider   cephALEXin (KEFLEX) 500 MG capsule Take 1 capsule (500 mg total) by mouth 2 (two) times daily for 10 days. 20 capsule Becky Augusta, NP   phenazopyridine (PYRIDIUM) 200 MG tablet Take 1 tablet (200 mg total) by mouth 3 (three) times daily. 6 tablet Becky Augusta, NP      PDMP not reviewed this encounter.   Becky Augusta, NP 09/02/22 1330

## 2022-09-02 NOTE — ED Triage Notes (Signed)
BIB EMS/ treated today @ UC for UTI, treated with one dose of keflex/ 102.3 oral temp @ home/ pt fell at home, pt states his "legs gave out"/ denies LOC or dizziness/ no sig hx of UTI

## 2022-09-02 NOTE — Assessment & Plan Note (Signed)
Urinary tract infection Sepsis criteria include fever, tachycardia and tachypnea with elevated white cell count unknown source of infection IV sepsis fluids Rocephin Follow cultures

## 2022-09-02 NOTE — ED Provider Notes (Signed)
[facility] Provider Note    Event Date/Time   First MD Initiated Contact with Patient 09/02/22 2110     (approximate)   History   Chief Complaint: Fever   HPI  Clayton Reyes is a 77 y.o. male with a history of hyperlipidemia, hypertension who comes to the ED due to generalized weakness.  Reports that yesterday he started having fever, lower abdominal pain, fatigue, dysuria.  He went to urgent care today, found to be febrile tachycardic and had a urinalysis that was positive for cystitis.  He was started on Keflex.  He took a first dose at home, but felt so weak that he had a total of 3 falls.  2 of them he describes as mechanical due to feeling too weak to hold himself up.  His spouse at bedside describes her third fall in which the patient was found on the floor in the hallway, seeming to have just woken up and confused.  Patient has no recollection of that event.  No history of seizures.     Physical Exam   Triage Vital Signs: ED Triage Vitals  Enc Vitals Group     BP 09/02/22 2053 128/64     Pulse Rate 09/02/22 2053 (!) 110     Resp 09/02/22 2053 18     Temp 09/02/22 2053 (!) 102.7 F (39.3 C)     Temp Source 09/02/22 2053 Oral     SpO2 09/02/22 2050 100 %     Weight 09/02/22 2054 206 lb 12.8 oz (93.8 kg)     Height --      Head Circumference --      Peak Flow --      Pain Score 09/02/22 2054 3     Pain Loc --      Pain Edu? --      Excl. in GC? --     Most recent vital signs: Vitals:   09/02/22 2050 09/02/22 2053  BP:  128/64  Pulse:  (!) 110  Resp:  18  Temp:  (!) 102.7 F (39.3 C)  SpO2: 100% 96%    General: Awake, no distress.  CV:  Good peripheral perfusion.  Tachycardia heart rate 110 Resp:  Normal effort.  Clear to auscultation bilaterally Abd:  No distention.  Soft with suprapubic tenderness.  No CVA tenderness Other:  Head is atraumatic, no spinal tenderness, full range of motion all extremities without bony  tenderness or wounds.   ED Results / Procedures / Treatments   Labs (all labs ordered are listed, but only abnormal results are displayed) Labs Reviewed  CULTURE, BLOOD (ROUTINE X 2)  CULTURE, BLOOD (ROUTINE X 2)  LACTIC ACID, PLASMA  LACTIC ACID, PLASMA  COMPREHENSIVE METABOLIC PANEL  CBC WITH DIFFERENTIAL/PLATELET  URINALYSIS, ROUTINE W REFLEX MICROSCOPIC     EKG Interpreted by me Sinus tachycardia rate 111.  Left axis, normal intervals.  Normal QRS ST segments T waves.   RADIOLOGY    PROCEDURES:  .Critical Care  Performed by: Sharman Cheek, MD Authorized by: Sharman Cheek, MD   Critical care provider statement:    Critical care time (minutes):  35   Critical care time was exclusive of:  Separately billable procedures and treating other patients   Critical care was necessary to treat or prevent imminent or life-threatening deterioration of the following conditions:  Sepsis and metabolic crisis   Critical care was time spent personally by me on the following activities:  Development of treatment plan  with patient or surrogate, discussions with consultants, evaluation of patient's response to treatment, examination of patient, obtaining history from patient or surrogate, ordering and performing treatments and interventions, ordering and review of laboratory studies, ordering and review of radiographic studies, pulse oximetry, re-evaluation of patient's condition and review of old charts   Care discussed with: admitting provider      MEDICATIONS ORDERED IN ED: Medications  cefTRIAXone (ROCEPHIN) 2 g in sodium chloride 0.9 % 100 mL IVPB (has no administration in time range)     IMPRESSION / MDM / ASSESSMENT AND PLAN / ED COURSE  I reviewed the triage vital signs and the nursing notes.  DDx: Cystitis, pyelonephritis, sepsis, anemia, electrolyte abnormality, AKI  Patient's presentation is most consistent with acute presentation with potential threat to life or  bodily function.  Patient presents with fever tachycardia syncope in the setting of recently diagnosed UTI.  Blood pressure is normal, no signs of shock.  He does appear to be septic.  Lactate is normal.  Blood cultures ordered, Rocephin ordered.  No symptoms to suggest ureteral obstruction or intra-abdominal abscess.  Case discussed with hospitalist for further management.       FINAL CLINICAL IMPRESSION(S) / ED DIAGNOSES   Final diagnoses:  Cystitis  Sepsis, due to unspecified organism, unspecified whether acute organ dysfunction present [facility])     Rx / DC Orders   ED Discharge Orders     None        Note:  This document was prepared using Dragon voice recognition software and may include unintentional dictation errors.   Sharman Cheek, MD 09/02/22 2329

## 2022-09-02 NOTE — Assessment & Plan Note (Signed)
Currently on meloxicam daily.  Will hold until improved Tylenol as needed for arthritic pain

## 2022-09-02 NOTE — Assessment & Plan Note (Signed)
Potassium 2.6 in the ED and treated with KCl 60 M EQ p.o. Likely secondary to HCTZ which is currently on hold Will recheck and replete as necessary

## 2022-09-02 NOTE — Assessment & Plan Note (Signed)
Hemoglobin at baseline

## 2022-09-02 NOTE — H&P (Signed)
History and Physical    Patient: Clayton Reyes WGN:[id number] DOB: [birth year] DOA: 09/02/2022 DOS: the patient was seen and examined on 09/02/2022 PCP: Marina Goodell, MD  Patient coming from: Home  Chief Complaint:  Chief Complaint  Patient presents with   Fever    HPI: Clayton Reyes is a 77 y.o. male with medical history significant for Hypertension, headaches, osteoarthritis, chronic anemia, who presents to the ED by EMS for management of a UTI, now associated with falls and a possible syncopal event.  Patient was in his usual state of health until the day prior when he developed increased urinary frequency and urgency, low back pain and chills.  He has had no prior UTIs prostate problems or urologic procedures.  He went to the urgent care who diagnosed him with UTI and gave him a prescription for Keflex.  He returned home but on his return had a up to 3 falls and is not sure if he lost consciousness,.  EMS was subsequently called.  Temperature with EMS was 102.3. ED course and data review: Tmax 102.7 with pulse 110 and respirations 23, BP 128/64.  Labs significant for WBC 13,600, lactic acid 1.4.  Hemoglobin at baseline at 12.8.  CMP notable for potassium of 2.6, BUN 26. EKG, personally viewed and interpreted showing sinus tachycardia at 111 with incomplete RBBB No imaging done Patient treated with ceftriaxone 2 g, and IV fluid bolus and given a 60 mEq KCl tablet. Hospitalist consulted for admission.   Review of Systems: As mentioned in the history of present illness. All other systems reviewed and are negative.  Past Medical History:  Diagnosis Date   Anemia 2011   Arthritis    right thumb   Headache(784.0)    Hypertension    Past Surgical History:  Procedure Laterality Date   COLONOSCOPY WITH PROPOFOL N/A 04/09/2016   Procedure: COLONOSCOPY WITH PROPOFOL;  Surgeon: Christena Deem, MD;  Location: [facility] ENDOSCOPY;  Service: Endoscopy;  Laterality: N/A;   CYST REMOVAL HAND  Left    CYST REMOVAL NECK Right    HERNIA REPAIR N/A 2014   INGUINAL HERNIA REPAIR Right 12/16/2016   Procedure: LAPAROSCOPIC INGUINAL HERNIA Repair;  Surgeon: Leafy Ro, MD;  Location: [facility] ORS;  Service: General;  Laterality: Right;   LUMBAR LAMINECTOMY/DECOMPRESSION MICRODISCECTOMY Right 06/06/2012   Procedure: LUMBAR LAMINECTOMY/DECOMPRESSION MICRODISCECTOMY 1 LEVEL;  Surgeon: Mariam Dollar, MD;  Location: [facility] NEURO ORS;  Service: Neurosurgery;  Laterality: Right;  Right Lumbar Three-Four Decompressive Laminectomy and Microdiskectomy   ORIF ORBITAL FRACTURE Left 1971   Social History:  reports that he quit smoking about 34 years ago. His smoking use included cigarettes. He has a 20.00 pack-year smoking history. He has never used smokeless tobacco. He reports current alcohol use. He reports that he does not use drugs.  No Known Allergies  Family History  Problem Relation Age of Onset   Diabetes Mother    Stroke Mother    Parkinson's disease Father    Heart disease Father    Stroke Sister    Diabetes Sister     Prior to Admission medications   Medication Sig Start Date End Date Taking? Authorizing Provider  cephALEXin (KEFLEX) 500 MG capsule Take 1 capsule (500 mg total) by mouth 2 (two) times daily for 10 days. 09/02/22 09/12/22 Yes Becky Augusta, NP  gabapentin (NEURONTIN) 600 MG tablet Take 600-1,200 mg by mouth 2 (two) times daily. Takes 1 tablet (600 mg) in the morning and 2 tablets (  1200 mg) in the evening . 11/17/16  Yes [provider]  hypromellose (SYSTANE OVERNIGHT THERAPY) 0.3 % GEL ophthalmic ointment Place 1 application into both eyes at bedtime.   Yes [provider]  Lifitegrast Benay Spice) 5 % SOLN Apply 1 drop to eye 2 (two) times daily.    Yes [provider]  losartan-hydrochlorothiazide (HYZAAR) 100-25 MG per tablet Take 1 tablet by mouth daily.   Yes [provider]  meloxicam (MOBIC) 15 MG tablet Take 15 mg by mouth daily.    Yes  [provider]  Omega-3 Fatty Acids (FISH OIL) 1000 MG CAPS Take 1,000 mg by mouth daily.   Yes [provider]  phenazopyridine (PYRIDIUM) 200 MG tablet Take 1 tablet (200 mg total) by mouth 3 (three) times daily. 09/02/22  Yes Becky Augusta, NP  Polyethyl Glycol-Propyl Glycol (SYSTANE ULTRA OP) Apply 1 drop to eye as needed (dry eyes).   Yes [provider]  rosuvastatin (CRESTOR) 5 MG tablet Take 5 mg by mouth daily. 04/22/22  Yes [provider]  verapamil (CALAN-SR) 180 MG CR tablet Take 180 mg by mouth at bedtime.   Yes [provider]    Physical Exam: Vitals:   09/02/22 2050 09/02/22 2053 09/02/22 2054 09/02/22 2130  BP:  128/64  127/66  Pulse:  (!) 110  (!) 107  Resp:  18  (!) 23  Temp:  (!) 102.7 F (39.3 C)    TempSrc:  Oral    SpO2: 100% 96%  95%  Weight:   93.8 kg    Physical Exam Vitals and nursing note reviewed.  Constitutional:      General: He is not in acute distress. HENT:     Head: Normocephalic and atraumatic.  Cardiovascular:     Rate and Rhythm: Regular rhythm. Tachycardia present.     Heart sounds: Normal heart sounds.  Pulmonary:     Effort: Pulmonary effort is normal.     Breath sounds: Normal breath sounds.  Abdominal:     Palpations: Abdomen is soft.     Tenderness: There is no abdominal tenderness.  Neurological:     Mental Status: Mental status is at baseline.     Labs on Admission: I have personally reviewed following labs and imaging studies  CBC: Recent Labs  Lab 09/02/22 2100  WBC 13.6*  NEUTROABS 11.8*  HGB 12.8*  HCT 37.7*  MCV 90.8  PLT 197   Basic Metabolic Panel: Recent Labs  Lab 09/02/22 2100  NA 132*  K 2.6*  CL 99  CO2 23  GLUCOSE 163*  BUN 26*  CREATININE 1.12  CALCIUM 8.8*   GFR: CrCl cannot be calculated (Unknown ideal weight.). Liver Function Tests: Recent Labs  Lab 09/02/22 2100  AST 26  ALT 21  ALKPHOS 58  BILITOT 1.2  PROT 7.2  ALBUMIN 4.2   No  results for input(s): "LIPASE", "AMYLASE" in the last 168 hours. No results for input(s): "AMMONIA" in the last 168 hours. Coagulation Profile: No results for input(s): "INR", "PROTIME" in the last 168 hours. Cardiac Enzymes: No results for input(s): "CKTOTAL", "CKMB", "CKMBINDEX", "TROPONINI" in the last 168 hours. BNP (last 3 results) No results for input(s): "PROBNP" in the last 8760 hours. HbA1C: No results for input(s): "HGBA1C" in the last 72 hours. CBG: No results for input(s): "GLUCAP" in the last 168 hours. Lipid Profile: No results for input(s): "CHOL", "HDL", "LDLCALC", "TRIG", "CHOLHDL", "LDLDIRECT" in the last 72 hours. Thyroid Function Tests: No results for  input(s): "TSH", "T4TOTAL", "FREET4", "T3FREE", "THYROIDAB" in the last 72 hours. Anemia Panel: No results for input(s): "VITAMINB12", "FOLATE", "FERRITIN", "TIBC", "IRON", "RETICCTPCT" in the last 72 hours. Urine analysis:    Component Value Date/Time   COLORURINE YELLOW 09/02/2022 1246   APPEARANCEUR CLOUDY (A) 09/02/2022 1246   LABSPEC 1.025 09/02/2022 1246   PHURINE 6.5 09/02/2022 1246   GLUCOSEU NEGATIVE 09/02/2022 1246   HGBUR LARGE (A) 09/02/2022 1246   BILIRUBINUR SMALL (A) 09/02/2022 1246   KETONESUR TRACE (A) 09/02/2022 1246   PROTEINUR 100 (A) 09/02/2022 1246   NITRITE POSITIVE (A) 09/02/2022 1246   LEUKOCYTESUR SMALL (A) 09/02/2022 1246    Radiological Exams on Admission: No results found.   Data Reviewed: Relevant notes from primary care and specialist visits, past discharge summaries as available in EHR, including Care Everywhere. Prior diagnostic testing as pertinent to current admission diagnoses Updated medications and problem lists for reconciliation ED course, including vitals, labs, imaging, treatment and response to treatment Triage notes, nursing and pharmacy notes and ED provider's notes Notable results as noted in HPI   Assessment and Plan: * Sepsis (HCC) Urinary tract  infection Sepsis criteria include fever, tachycardia and tachypnea with elevated white cell count unknown source of infection IV sepsis fluids Rocephin Follow cultures  Syncope and collapse Possible acute metabolic encephalopathy related to sepsis Secondary to weakness related to sepsis Continuous cardiac monitoring Deferring additional syncope workup for now given suspicion is that this is related to sepsis  Hypokalemia Potassium 2.6 in the ED and treated with KCl 60 M EQ p.o. Likely secondary to HCTZ which is currently on hold Will recheck and replete as necessary   Arthritis Currently on meloxicam daily.  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  Will hold home losartan/HCTZ and continue verapamil  Anemia, unspecified Hemoglobin at baseline        DVT prophylaxis: Lovenox  Consults: none  Advance Care Planning: full code  Family Communication: none  Disposition Plan: Back to previous home environment  Severity of Illness: The appropriate patient status for this patient is INPATIENT. Inpatient status is judged to be reasonable and necessary in order to provide the required intensity of service to ensure the patient's safety. The patient's presenting symptoms, physical exam findings, and initial radiographic and laboratory data in the context of their chronic comorbidities is felt to place them at high risk for further clinical deterioration. Furthermore, it is not anticipated that the patient will be medically stable for discharge from the hospital within 2 midnights of admission.   * I certify that at the point of admission it is my clinical judgment that the patient will require inpatient hospital care spanning beyond 2 midnights from the point of admission due to high intensity of service, high risk for further deterioration and high frequency of  surveillance required.*  Author: Andris Baumann, MD 09/02/2022 10:31 PM  For on call review [email or web address].

## 2022-09-02 NOTE — ED Notes (Signed)
Attempted to pull all basic bloodwork off pt's L fa 20g EMS IV. IV flushes with mild difficulty due to malpositioning; IV pulls back blood slowly to adapter but then will not fill lab tubes. Primary RN notified. IV cart at bedside for fresh poke for blood cultures if needed.

## 2022-09-02 NOTE — Assessment & Plan Note (Signed)
Current BP 120/66.  Will hold home losartan/HCTZ and continue verapamil

## 2022-09-02 NOTE — Discharge Instructions (Addendum)
Take the Keflex twice daily for 10 days with food for treatment of urinary tract infection. ? ?Use the Pyridium every 8 hours as needed for urinary discomfort.  This will turn your urine a bright red-orange. ? ?Increase your oral fluid intake so that you increase your urine production and or flushing your urinary system. ? ?Take an over-the-counter probiotic, such as Culturelle-Align-Activia, 1 hour after each dose of antibiotic to prevent diarrhea or yeast infections from forming. ? ?We will culture urine and change the antibiotics if necessary. ? ?Return for reevaluation, or see your primary care provider, for any new or worsening symptoms.  ?

## 2022-09-02 NOTE — Assessment & Plan Note (Signed)
Continue gabapentin.

## 2022-09-02 NOTE — ED Triage Notes (Addendum)
Pt c/o urinary freq,burning,pain & fever x1 day. Denies any hematuria. Has tried tylenol w/o relief. Last dose around 1:30 this AM.

## 2022-09-03 DIAGNOSIS — E876 Hypokalemia: Secondary | ICD-10-CM

## 2022-09-03 DIAGNOSIS — N3000 Acute cystitis without hematuria: Secondary | ICD-10-CM | POA: Diagnosis not present

## 2022-09-03 DIAGNOSIS — R55 Syncope and collapse: Secondary | ICD-10-CM | POA: Diagnosis not present

## 2022-09-03 DIAGNOSIS — A419 Sepsis, unspecified organism: Secondary | ICD-10-CM

## 2022-09-03 LAB — BASIC METABOLIC PANEL
Anion gap: 8 (ref 5–15)
BUN: 22 mg/dL (ref 8–23)
CO2: 22 mmol/L (ref 22–32)
Calcium: 8.1 mg/dL — ABNORMAL LOW (ref 8.9–10.3)
Chloride: 103 mmol/L (ref 98–111)
Creatinine, Ser: 0.87 mg/dL (ref 0.61–1.24)
GFR, Estimated: >60 mL/min (ref >60)
Glucose, Bld: 120 mg/dL — ABNORMAL HIGH (ref 70–99)
Potassium: 3 mmol/L — ABNORMAL LOW (ref 3.5–5.1)
Sodium: 133 mmol/L — ABNORMAL LOW (ref 135–145)

## 2022-09-03 LAB — CBC
HCT: 36.2 % — ABNORMAL LOW (ref 39.0–52.0)
Hemoglobin: 12 g/dL — ABNORMAL LOW (ref 13.0–17.0)
MCH: 30.5 pg (ref 26.0–34.0)
MCHC: 33.1 g/dL (ref 30.0–36.0)
MCV: 92.1 fL (ref 80.0–100.0)
Platelets: 161 10*3/uL (ref 150–400)
RBC: 3.93 MIL/uL — ABNORMAL LOW (ref 4.22–5.81)
RDW: 12.5 % (ref 11.5–15.5)
WBC: 12.3 10*3/uL — ABNORMAL HIGH (ref 4.0–10.5)
nRBC: 0 % (ref 0.0–0.2)

## 2022-09-03 LAB — MAGNESIUM: Magnesium: 2.1 mg/dL (ref 1.7–2.4)

## 2022-09-03 LAB — LACTIC ACID, PLASMA: Lactic Acid, Venous: 1.1 mmol/L (ref 0.5–1.9)

## 2022-09-03 LAB — CULTURE, BLOOD (ROUTINE X 2)
Culture: NO GROWTH
Special Requests: ADEQUATE

## 2022-09-03 LAB — POTASSIUM: Potassium: 3.1 mmol/L — ABNORMAL LOW (ref 3.5–5.1)

## 2022-09-03 MED ORDER — ORAL CARE MOUTH RINSE: 15.0000 mL | OROMUCOSAL | Status: DC | PRN

## 2022-09-03 MED ORDER — POTASSIUM CHLORIDE CRYS ER 20 MEQ PO TBCR
40.0000 meq | EXTENDED_RELEASE_TABLET | Freq: Once | ORAL | Status: AC
  Administered 2022-09-03: 40 meq via ORAL
  Filled 2022-09-03: qty 2

## 2022-09-03 MED ORDER — HYDROCODONE-ACETAMINOPHEN 5-325 MG PO TABS: 1.0000 | ORAL_TABLET | ORAL | Status: DC | PRN

## 2022-09-03 MED ORDER — SODIUM CHLORIDE 0.9 % IV SOLN
2.0000 g | Freq: Three times a day (TID) | INTRAVENOUS | Status: DC
  Administered 2022-09-03 – 2022-09-04 (×3): 2 g via INTRAVENOUS
  Filled 2022-09-03 (×4): qty 12.5

## 2022-09-03 MED ORDER — LIFITEGRAST 5 % OP SOLN
1.0000 [drp] | Freq: Two times a day (BID) | OPHTHALMIC | Status: DC
  Administered 2022-09-03 – 2022-09-04 (×2): 1 [drp] via OPHTHALMIC
  Filled 2022-09-03: qty 5

## 2022-09-03 NOTE — Progress Notes (Addendum)
Update 2358: Pt refused bed alarm but was educated about safety. Will continue to monitor.  Pt potassium is at 3.1. NP Jon Billings made aware. Will continue to monitor.  Update 0200: NP Jon Billings placed order. Will continue to monitor.

## 2022-09-03 NOTE — Plan of Care (Signed)
  Problem: Education: Goal: Knowledge of General Education information will improve Description: Including pain rating scale, medication(s)/side effects and non-pharmacologic comfort measures Outcome: Progressing   Problem: Clinical Measurements: Goal: Will remain free from infection Outcome: Progressing   Problem: Clinical Measurements: Goal: Respiratory complications will improve Outcome: Progressing   Problem: Clinical Measurements: Goal: Cardiovascular complication will be avoided Outcome: Progressing   Problem: Activity: Goal: Risk for activity intolerance will decrease Outcome: Progressing   Problem: Pain Managment: Goal: General experience of comfort will improve Outcome: Progressing   

## 2022-09-03 NOTE — Progress Notes (Signed)
Pt made aware that we do not carry lifitegrast eyedrops states" I will let someone bring it today". Pharmacy made aware. Will continue to monitor.

## 2022-09-03 NOTE — Progress Notes (Signed)
Anticoagulation monitoring(Lovenox):  77 yo male ordered Lovenox 45 mg Q24h    Filed Weights   09/02/22 2054 09/03/22 0014  Weight: 93.8 kg (206 lb 12.8 oz) 90.5 kg (199 lb 8.3 oz)   BMI 30.3   Lab Results  Component Value Date   CREATININE 1.12 09/02/2022   CREATININE 0.90 02/25/2017   CREATININE 0.97 09/07/2013   Estimated Creatinine Clearance: 61.3 mL/min (by C-G formula based on SCr of 1.12 mg/dL). Hemoglobin & Hematocrit     Component Value Date/Time   HGB 12.8 (L) 09/02/2022 2100   HGB 13.6 09/07/2013 1439   HCT 37.7 (L) 09/02/2022 2100   HCT 39.5 (L) 09/07/2013 1439     Per Protocol for Patient with estCrcl > 30 ml/min and BMI > 30, will transition to Lovenox 45 mg Q24h.

## 2022-09-03 NOTE — Progress Notes (Signed)
PROGRESS NOTE    Clayton Reyes  ZOX:[id number] DOB: [birth year] DOA: 09/02/2022 PCP: Marina Goodell, MD (Confirm with patient/family/NH records and if not entered, this HAS to be entered at [facility] point of entry. "No PCP" if truly none.)   Brief Narrative: (Start on day 1 of progress note - keep it brief and live) 77 y.o. male with medical history significant for Hypertension, headaches, osteoarthritis, chronic anemia, who presents to the ED by EMS for management of a UTI, now associated with falls and a possible syncopal event.   7/4: PT and OT eval, replete potassium, change IV Rocephin -> cefepime Assessment & Plan:   Principal Problem:   Sepsis (HCC) Active Problems:   Urinary tract infection   Hypokalemia   Syncope and collapse   Anemia, unspecified   Hypertension   Intractable episodic paroxysmal hemicrania   Arthritis   * Sepsis (HCC) Urinary tract infection Sepsis criteria include fever, tachycardia and tachypnea with elevated white cell count unknown source of infection Urine culture growing Enterobacter erogenous.  Will switch IV Rocephin to cefepime for now   Syncope and collapse Possible acute metabolic encephalopathy related to sepsis Secondary to weakness related to sepsis Continuous cardiac monitoring Deferring additional syncope workup for now given suspicion is that this is related to sepsis. PT and OT eval   Hypokalemia Potassium 2.6 in the ED and treated with KCl 60 M EQ p.o. Likely secondary to HCTZ which is currently on hold Potassium 3.0 today.  Will replete and recheck     Arthritis Currently on meloxicam daily.  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  Will hold home losartan/HCTZ and continue verapamil   Anemia, unspecified Hemoglobin at baseline     DVT prophylaxis: Lovenox      Code Status: Full  code Family Communication: NO "discussed with patient" Disposition Plan: Likely discharge home tomorrow depending on clinical condition   Antimicrobials: (specify start and planned stop date. Auto populated tables are space occupying and do not give end dates) Cefepime IV   Subjective: Patient feeling much better  Objective: Vitals:   09/03/22 0014 09/03/22 0330 09/03/22 0831 09/03/22 0959  BP:  (!) 143/59 136/72 133/72  Pulse:  94 88 91  Resp:  18 19 18   Temp:  98.4 F (36.9 C) 99.4 F (37.4 C) 98.9 F (37.2 C)  TempSrc:   Oral Oral  SpO2:  96% 98% 97%  Weight: 90.5 kg     Height: 5\' 8"  (1.727 m)       Intake/Output Summary (Last 24 hours) at 09/03/2022 1514 Last data filed at 09/03/2022 0728 Gross per 24 hour  Intake 1057.3 ml  Output 350 ml  Net 707.3 ml   Filed Weights   09/02/22 2054 09/03/22 0014  Weight: 93.8 kg 90.5 kg    Examination:  General exam: Appears calm and comfortable  Respiratory system: Clear to auscultation. Respiratory effort normal. Cardiovascular system: S1 & S2 heard, RRR. No JVD, murmurs, rubs, gallops or clicks. No pedal edema. Gastrointestinal system: Abdomen is nondistended, soft and nontender. No organomegaly or masses felt. Normal bowel sounds heard. Central nervous system: Alert and oriented. No focal neurological deficits. Extremities: Symmetric 5 x 5 power. Skin: No rashes, lesions or ulcers Psychiatry: Judgement and insight appear normal. Mood & affect appropriate.     Data Reviewed: I have personally reviewed following labs and imaging studies  CBC: Recent  Labs  Lab 09/02/22 2100 09/03/22 0617  WBC 13.6* 12.3*  NEUTROABS 11.8*  --   HGB 12.8* 12.0*  HCT 37.7* 36.2*  MCV 90.8 92.1  PLT 197 161   Basic Metabolic Panel: Recent Labs  Lab 09/02/22 2100 09/03/22 0009 09/03/22 0617  NA 132*  --  133*  K 2.6* 3.1* 3.0*  CL 99  --  103  CO2 23  --  22  GLUCOSE 163*  --  120*  BUN 26*  --  22  CREATININE 1.12  --   0.87  CALCIUM 8.8*  --  8.1*  MG  --   --  2.1   GFR: Estimated Creatinine Clearance: 78.9 mL/min (by C-G formula based on SCr of 0.87 mg/dL). Liver Function Tests: Recent Labs  Lab 09/02/22 2100  AST 26  ALT 21  ALKPHOS 58  BILITOT 1.2  PROT 7.2  ALBUMIN 4.2    Sepsis Labs: Recent Labs  Lab 09/02/22 2100 09/03/22 0009  LATICACIDVEN 1.4 1.1    Recent Results (from the past 240 hour(s))  Urine Culture     Status: Abnormal (Preliminary result)   Collection Time: 09/02/22 12:46 PM   Specimen: Urine, Random  Result Value Ref Range Status   Specimen Description   Final    URINE, RANDOM Performed at [facility], [address], [place], [place] [address]    Special Requests   Final    NONE Reflexed from [id number] Performed at [facility], [address], [place], [place] [address]    Culture >=100,000 COLONIES/mL ENTEROBACTER AEROGENES (A)  Final   Report Status PENDING  Incomplete  Blood Culture (routine x 2)     Status: None (Preliminary result)   Collection Time: 09/02/22  9:00 PM   Specimen: BLOOD  Result Value Ref Range Status   Specimen Description BLOOD  LEFT HAND  Final   Special Requests   Final    BOTTLES DRAWN AEROBIC AND ANAEROBIC Blood Culture adequate volume   Culture   Final    NO GROWTH < 12 HOURS Performed at [facility], [address] [place], [place] [address]    Report Status PENDING  Incomplete  Blood Culture (routine x 2)     Status: None (Preliminary result)   Collection Time: 09/02/22  9:00 PM   Specimen: BLOOD  Result Value Ref Range Status   Specimen Description BLOOD  RIGHT FOREARM  Final   Special Requests   Final    BOTTLES DRAWN AEROBIC AND ANAEROBIC Blood Culture results may not be optimal due to an inadequate volume of blood received in culture bottles   Culture   Final    NO GROWTH < 12 HOURS Performed at [facility], [address] [place], [place] [address]    Report Status  PENDING  Incomplete      Scheduled Meds:  enoxaparin (LOVENOX) injection  0.5 mg/kg Subcutaneous Q24H   gabapentin  600 mg Oral BID   Lifitegrast  1 drop Ophthalmic BID   phenazopyridine  200 mg Oral TID   rosuvastatin  5 mg Oral Daily   verapamil  180 mg Oral QHS   Continuous Infusions:  ceFEPime (MAXIPIME) IV     lactated ringers 150 mL/hr (09/03/22 0729)     LOS: 1 day    Time spent: 35 minutes    Delfino Lovett, MD [facility] Pager [phone]  If 7PM-7AM, please contact night-coverage [email or web address] Password [email or web address] 09/03/2022, 3:14 PM

## 2022-09-04 DIAGNOSIS — N3000 Acute cystitis without hematuria: Secondary | ICD-10-CM | POA: Diagnosis not present

## 2022-09-04 DIAGNOSIS — R55 Syncope and collapse: Secondary | ICD-10-CM | POA: Diagnosis not present

## 2022-09-04 DIAGNOSIS — A419 Sepsis, unspecified organism: Secondary | ICD-10-CM | POA: Diagnosis not present

## 2022-09-04 DIAGNOSIS — E876 Hypokalemia: Secondary | ICD-10-CM | POA: Diagnosis not present

## 2022-09-04 LAB — BASIC METABOLIC PANEL
Anion gap: 7 (ref 5–15)
BUN: 17 mg/dL (ref 8–23)
CO2: 23 mmol/L (ref 22–32)
Calcium: 8.1 mg/dL — ABNORMAL LOW (ref 8.9–10.3)
Chloride: 106 mmol/L (ref 98–111)
Creatinine, Ser: 0.68 mg/dL (ref 0.61–1.24)
GFR, Estimated: >60 mL/min (ref >60)
Glucose, Bld: 104 mg/dL — ABNORMAL HIGH (ref 70–99)
Potassium: 3.3 mmol/L — ABNORMAL LOW (ref 3.5–5.1)
Sodium: 136 mmol/L (ref 135–145)

## 2022-09-04 LAB — CBC
HCT: 34.5 % — ABNORMAL LOW (ref 39.0–52.0)
Hemoglobin: 11.7 g/dL — ABNORMAL LOW (ref 13.0–17.0)
MCH: 31 pg (ref 26.0–34.0)
MCHC: 33.9 g/dL (ref 30.0–36.0)
MCV: 91.5 fL (ref 80.0–100.0)
Platelets: 158 10*3/uL (ref 150–400)
RBC: 3.77 MIL/uL — ABNORMAL LOW (ref 4.22–5.81)
RDW: 12.8 % (ref 11.5–15.5)
WBC: 10.5 10*3/uL (ref 4.0–10.5)
nRBC: 0 % (ref 0.0–0.2)

## 2022-09-04 LAB — URINE CULTURE: Culture: >=100000 — AB

## 2022-09-04 LAB — CULTURE, BLOOD (ROUTINE X 2): Culture: NO GROWTH

## 2022-09-04 MED ORDER — CIPROFLOXACIN HCL 500 MG PO TABS: 500.0000 mg | ORAL_TABLET | Freq: Two times a day (BID) | ORAL | 0 refills | Status: AC

## 2022-09-04 MED ORDER — POTASSIUM CHLORIDE CRYS ER 20 MEQ PO TBCR
40.0000 meq | EXTENDED_RELEASE_TABLET | Freq: Once | ORAL | Status: AC
  Administered 2022-09-04: 40 meq via ORAL
  Filled 2022-09-04: qty 2

## 2022-09-04 NOTE — Evaluation (Signed)
Physical Therapy Evaluation Patient Details Name: Clayton Reyes MRN: [id number] DOB: [birth year] Today's Date: 09/04/2022  History of Present Illness  Pt is a 77 y.o. male with PMH including HTN, headaches, OA, chronic anemia. Pt admitted to ED (7/3) with generalized weakness and urinary symptoms as well as several syncopal episodes with falls. MD assessment includes: sepsis secondary to UTI, hypokalemia.    Clinical Impression  Pt pleasant and motivated for therapy. Pt negative for orthostatic vitals at start of session, with sitting BP at 133/72 and standing BP at 141/74. Pt mod I for STS from recliner; he had RW in front of him but was able to stand without significant UE support to assist him. Pt able to ambulate ~200 feet with no AD independently. Self-selected reciprocal gait pattern and consistent cadence throughout. Pt able to go up/down 4 stairs mod I with step-through pattern and only using the R rail. Occasionally he would defer to step-to pattern but only when conversing throughout stair navigation. His vitals remained WNL throughout the session, with O2 in high 90's and HR 70's to mid 80's. Pt reported feeling like he was at his functional baseline and didn't need continued PT services going forward.      Assistance Recommended at Discharge PRN  If plan is discharge home, recommend the following:  Can travel by private vehicle  Assistance with cooking/housework;Help with stairs or ramp for entrance        Equipment Recommendations None recommended by PT  Recommendations for Other Services       Functional Status Assessment Patient has not had a recent decline in their functional status     Precautions / Restrictions Precautions Precautions: None Restrictions Weight Bearing Restrictions: No      Mobility  Bed Mobility               General bed mobility comments: NT- pt in recliner upon arrival    Transfers Overall transfer level: Modified  independent Equipment used: Rolling walker (2 wheels)               General transfer comment: Pt stood from recliner with no physical assistance needed, hands on RW but didn't really need or use it for assistance    Ambulation/Gait Ambulation/Gait assistance: Independent Gait Distance (Feet): 200 Feet Assistive device: None Gait Pattern/deviations: WFL(Within Functional Limits), Step-through pattern Gait velocity: slightly decreased     General Gait Details: Self-selected reciprocal gait pattern. No instances of LOB or legs buckling.  Stairs Stairs: Yes Stairs assistance: Mod I  Stair Management: One rail Right, Forwards, Alternating pattern Number of Stairs: 4 General stair comments: Self-selected reciprocal pattern both up and down; occasional instances of step-to but only when conversing (dual tasking). Good eccentric/concentric control throughout.  Wheelchair Mobility     Tilt Bed    Modified Rankin (Stroke Patients Only)       Balance Overall balance assessment: Independent                                           Pertinent Vitals/Pain Pain Assessment Pain Assessment: No/denies pain    Home Living Family/patient expects to be discharged to:: Private residence Living Arrangements: Spouse/significant other Available Help at Discharge: Family;Available 24 hours/day Type of Home: House Home Access: Stairs to enter Entrance Stairs-Rails: Right;Left;Can reach both Entrance Stairs-Number of Steps: 2 Alternate Level Stairs-Number of Steps: approx 13- rail going  down on the left; bedroom is on main level where you enter, downstairs is laundry, Home Layout: Multi-level Home Equipment: Agricultural consultant (2 wheels);Cane - quad;Grab bars - tub/shower Additional Comments: equipment from family members; grab bars in both tub/shower and shower    Prior Function Prior Level of Function : Independent/Modified Independent;Driving              Mobility Comments: No AD use, described community ambulator. 3 falls in last 6 months (all on same day when syncopal episodes occurred) ADLs Comments: indep ADL/IADL; retired     Higher education careers adviser        Extremity/Trunk Assessment   Upper Extremity Assessment Upper Extremity Assessment: Overall WFL for tasks assessed    Lower Extremity Assessment Lower Extremity Assessment: Overall WFL for tasks assessed    Cervical / Trunk Assessment Cervical / Trunk Assessment: Normal  Communication   Communication: No difficulties  Cognition Arousal/Alertness: Awake/alert Behavior During Therapy: WFL for tasks assessed/performed Overall Cognitive Status: Within Functional Limits for tasks assessed                                          General Comments General comments (skin integrity, edema, etc.): No reports of dizziness throughout session. Pt negative for orthostatic vitals at start of session.    Exercises     Assessment/Plan    PT Assessment Patient does not need any further PT services  PT Problem List         PT Treatment Interventions      PT Goals (Current goals can be found in the Care Plan section)  Acute Rehab PT Goals PT Goal Formulation: All assessment and education complete, DC therapy    Frequency       Co-evaluation               AM-PAC PT "6 Clicks" Mobility  Outcome Measure Help needed turning from your back to your side while in a flat bed without using bedrails?: None Help needed moving from lying on your back to sitting on the side of a flat bed without using bedrails?: None Help needed moving to and from a bed to a chair (including a wheelchair)?: None Help needed standing up from a chair using your arms (e.g., wheelchair or bedside chair)?: None Help needed to walk in hospital room?: None Help needed climbing 3-5 steps with a railing? : None 6 Click Score: 24    End of Session Equipment Utilized During Treatment: Gait  belt Activity Tolerance: Patient tolerated treatment well Patient left: in chair;with call bell/phone within reach Nurse Communication: Mobility status PT Visit Diagnosis: Repeated falls (R29.6);Muscle weakness (generalized) (M62.81)    Time: 4098-1191 PT Time Calculation (min) (ACUTE ONLY): 21 min   Charges:                 Jarelis Ehlert, SPT 09/04/22, 1:44 PM

## 2022-09-04 NOTE — Evaluation (Signed)
Occupational Therapy Evaluation Patient Details Name: Clayton Reyes MRN: [id number] DOB: [birth year] Today's Date: 09/04/2022   History of Present Illness Pt is a 77 year old male admitted with UTI, syncope and collapse, possible acute metabolic encephalopathy related to sepsis; pmh significant for r Hypertension, headaches, osteoarthritis, chronic anemia,   Clinical Impression   Chart reviewed, pt greeted in bed, alert and oriented x4, agreeable to OT evaluation. PTA pt is indep in ADL/IADL. Pt appears to be performing ADL close to functional baseline with slightly increased time required for tasks. Cognition appears intact. No further OT needs identified. OT will sign off, please re consult if there is a change in functional status.      Recommendations for follow up therapy are one component of a multi-disciplinary discharge planning process, led by the attending physician.  Recommendations may be updated based on patient status, additional functional criteria and insurance authorization.   Assistance Recommended at Discharge PRN  Patient can return home with the following      Functional Status Assessment  Patient has had a recent decline in their functional status and demonstrates the ability to make significant improvements in function in a reasonable and predictable amount of time.  Equipment Recommendations  None recommended by OT    Recommendations for Other Services       Precautions / Restrictions Precautions Precautions: None Restrictions Weight Bearing Restrictions: No      Mobility Bed Mobility Overal bed mobility: Needs Assistance Bed Mobility: Supine to Sit     Supine to sit: Modified independent (Device/Increase time)          Transfers Overall transfer level: Needs assistance Equipment used: None Transfers: Sit to/from Stand Sit to Stand: Modified independent (Device/Increase time)                  Balance Overall balance assessment: Needs  assistance Sitting-balance support: Feet supported Sitting balance-Leahy Scale: Normal     Standing balance support: No upper extremity supported, During functional activity Standing balance-Leahy Scale: Good                             ADL either performed or assessed with clinical judgement   ADL Overall ADL's : Needs assistance/impaired Eating/Feeding: Modified independent   Grooming: Modified independent Grooming Details (indicate cue type and reason): standing at sink level         Upper Body Dressing : Set up   Lower Body Dressing: Set up   Toilet Transfer: Modified Independent;Supervision/safety;Ambulation   Toileting- Clothing Manipulation and Hygiene: Modified independent;Supervision/safety       Functional mobility during ADLs: Supervision/safety;Modified independent (amb approx 200' in hallway with no AD)       Vision Patient Visual Report: No change from baseline       Perception     Praxis      Pertinent Vitals/Pain Pain Assessment Pain Assessment: No/denies pain     Hand Dominance     Extremity/Trunk Assessment Upper Extremity Assessment Upper Extremity Assessment: Overall WFL for tasks assessed   Lower Extremity Assessment Lower Extremity Assessment: Overall WFL for tasks assessed   Cervical / Trunk Assessment Cervical / Trunk Assessment: Normal   Communication Communication Communication: No difficulties   Cognition Arousal/Alertness: Awake/alert Behavior During Therapy: WFL for tasks assessed/performed Overall Cognitive Status: Within Functional Limits for tasks assessed  General Comments  No reports of dizziness throughout session. Pt negative for orthostatic vitals at start of session.    Exercises Other Exercises Other Exercises: edu re: role of OT, role of rehab, safe ADL completion, importance of continued mobility while admitted   Shoulder Instructions       Home Living Family/patient expects to be discharged to:: Private residence Living Arrangements: Spouse/significant other Available Help at Discharge: Family;Available 24 hours/day Type of Home: House Home Access: Stairs to enter [employer] of Steps: 2 Entrance Stairs-Rails: Right;Left;Can reach both Home Layout: Multi-level Alternate Level Stairs-Number of Steps: approx 13- rail going down on the left; bedroom is on main level where you enter, downstairs is laundry,   Bathroom Shower/Tub: Walk-in shower;Tub/shower unit   Bathroom Toilet: Standard Bathroom Accessibility: Yes   Home Equipment: Agricultural consultant (2 wheels);Cane - quad;Grab bars - tub/shower   Additional Comments: equipment from family members; grab bars in both tub/shower and shower      Prior Functioning/Environment Prior Level of Function : Independent/Modified Independent;Driving             Mobility Comments: No AD use, described community ambulator. 3 falls in last 6 months (all on same day when syncopal episodes occurred) ADLs Comments: indep ADL/IADL; retired        OT Problem List:        OT Treatment/Interventions:      OT Goals(Current goals can be found in the care plan section) Acute Rehab OT Goals Patient Stated Goal: go home OT Goal Formulation: With patient Time For Goal Achievement: 09/18/22 Potential to Achieve Goals: Good  OT Frequency:      Co-evaluation              AM-PAC OT "6 Clicks" Daily Activity     Outcome Measure Help from another person eating meals?: None Help from another person taking care of personal grooming?: None Help from another person toileting, which includes using toliet, bedpan, or urinal?: None Help from another person bathing (including washing, rinsing, drying)?: None Help from another person to put on and taking off regular upper body clothing?: None Help from another person to put on and taking off regular lower body clothing?:  None 6 Click Score: 24   End of Session Nurse Communication: Mobility status  Activity Tolerance: Patient tolerated treatment well Patient left: in chair;with call bell/phone within reach                   Time: 0905-0920 OT Time Calculation (min): 15 min Charges:  OT General Charges $OT Visit: 1 Visit OT Evaluation $OT Eval Low Complexity: 1 Low  Oleta Mouse, OTD OTR/L  09/04/22, 10:43 AM

## 2022-09-04 NOTE — Care Management Important Message (Signed)
Important Message  Patient Details  Name: Clayton Reyes MRN: [id number] Date of Birth: 14-Mar-1945   Medicare Important Message Given:  Yes     Johnell Comings 09/04/2022, 11:34 AM

## 2022-09-04 NOTE — Progress Notes (Signed)
Discharged. AVS printed, Reviewed. Home medications returned.  All belongings gathered.

## 2022-09-05 LAB — CULTURE, BLOOD (ROUTINE X 2)

## 2022-09-05 NOTE — Discharge Summary (Signed)
Physician Discharge Summary   Patient: Clayton Reyes MRN: [id number] DOB: February 14, 1946  Admit date:     09/02/2022  Discharge date: 09/04/2022  Discharge Physician: Delfino Lovett   PCP: Marina Goodell, MD   Recommendations at discharge:   Follow-up with outpatient providers as requested  Discharge Diagnoses: Principal Problem:   Sepsis (HCC) Active Problems:   Urinary tract infection   Hypokalemia   Syncope and collapse   Anemia, unspecified   Hypertension   Intractable episodic paroxysmal hemicrania   Arthritis  Hospital Course: Assessment and Plan:  * Sepsis (HCC) present on admission due to Urinary tract infection Sepsis criteria include fever, tachycardia and tachypnea with elevated white cell count-urine being the source of infection Urine culture growing Enterobacter erogenous.  Treated with IV cefepime and being discharged on oral Cipro based on urine culture sensitivity   Syncope and collapse Possible acute metabolic encephalopathy related to sepsis Secondary to weakness related to sepsis Deferring additional syncope workup for now given suspicion is that this is related to sepsis. PT and OT evaluated and did not find any skilled needs.  He is back to baseline.  He ambulated without any reoccurrence of symptoms.   Hypokalemia Likely secondary to HCTZ which is currently on hold Repleted counseled for potassium rich diet   Arthritis Intractable episodic paroxysmal hemicrania Continue gabapentin   Hypertension Well-controlled for now.   Anemia, unspecified Hemoglobin at baseline        Disposition: Home Diet recommendation:  Discharge Diet Orders (From admission, onward)     Start     Ordered   09/04/22 0000  Diet - low sodium heart healthy        09/04/22 1115           Carb modified diet DISCHARGE MEDICATION: Allergies as of 09/04/2022   No Known Allergies      Medication List     STOP taking these medications    cephALEXin 500 MG  capsule Commonly known as: KEFLEX       TAKE these medications    ciprofloxacin 500 MG tablet Commonly known as: Cipro Take 1 tablet (500 mg total) by mouth 2 (two) times daily for 5 days.   Fish Oil 1000 MG Caps Take 1,000 mg by mouth daily.   gabapentin 600 MG tablet Commonly known as: NEURONTIN Take 600-1,200 mg by mouth 2 (two) times daily. Takes 1 tablet (600 mg) in the morning and 2 tablets (1200 mg) in the evening .   losartan-hydrochlorothiazide 100-25 MG tablet Commonly known as: HYZAAR Take 1 tablet by mouth daily.   meloxicam 15 MG tablet Commonly known as: MOBIC Take 15 mg by mouth daily.   phenazopyridine 200 MG tablet Commonly known as: PYRIDIUM Take 1 tablet (200 mg total) by mouth 3 (three) times daily.   rosuvastatin 5 MG tablet Commonly known as: CRESTOR Take 5 mg by mouth daily.   Systane Overnight Therapy 0.3 % Gel ophthalmic ointment Generic drug: hypromellose Place 1 application into both eyes at bedtime.   SYSTANE ULTRA OP Apply 1 drop to eye as needed (dry eyes).   verapamil 180 MG CR tablet Commonly known as: CALAN-SR Take 180 mg by mouth at bedtime.   Xiidra 5 % Soln Generic drug: Lifitegrast Apply 1 drop to eye 2 (two) times daily.        Follow-up Information     Feldpausch, Madaline Guthrie, MD. Go on 09/09/2022.   Specialty: Family Medicine Why: [facility] Discharge F/UP. Go at  1:45pm. Contact information: [address] [place] [facility] [id number] [phone]                Discharge Exam: Filed Weights   09/02/22 2054 09/03/22 0014  Weight: 93.8 kg 90.5 kg   General exam: Appears calm and comfortable  Respiratory system: Clear to auscultation. Respiratory effort normal. Cardiovascular system: S1 & S2 heard, RRR. No JVD, murmurs, rubs, gallops or clicks. No pedal edema. Gastrointestinal system: Abdomen is nondistended, soft and nontender. No organomegaly or masses felt. Normal bowel sounds heard. Central nervous system:  Alert and oriented. No focal neurological deficits. Extremities: Symmetric 5 x 5 power. Skin: No rashes, lesions or ulcers Psychiatry: Judgement and insight appear normal. Mood & affect appropriate.   Condition at discharge: good  The results of significant diagnostics from this hospitalization (including imaging, microbiology, ancillary and laboratory) are listed below for reference.   Imaging Studies: No results found.  Microbiology: Results for orders placed or performed during the hospital encounter of 09/02/22  Blood Culture (routine x 2)     Status: None (Preliminary result)   Collection Time: 09/02/22  9:00 PM   Specimen: BLOOD  Result Value Ref Range Status   Specimen Description BLOOD  LEFT HAND  Final   Special Requests   Final    BOTTLES DRAWN AEROBIC AND ANAEROBIC Blood Culture adequate volume   Culture   Final    NO GROWTH 3 DAYS Performed at [facility], [address] [place], [place] [address]    Report Status PENDING  Incomplete  Blood Culture (routine x 2)     Status: None (Preliminary result)   Collection Time: 09/02/22  9:00 PM   Specimen: BLOOD  Result Value Ref Range Status   Specimen Description BLOOD  RIGHT FOREARM  Final   Special Requests   Final    BOTTLES DRAWN AEROBIC AND ANAEROBIC Blood Culture results may not be optimal due to an inadequate volume of blood received in culture bottles   Culture   Final    NO GROWTH 3 DAYS Performed at [facility], [address] [place], [place] [address]    Report Status PENDING  Incomplete    Labs: CBC: Recent Labs  Lab 09/02/22 2100 09/03/22 0617 09/04/22 0534  WBC 13.6* 12.3* 10.5  NEUTROABS 11.8*  --   --   HGB 12.8* 12.0* 11.7*  HCT 37.7* 36.2* 34.5*  MCV 90.8 92.1 91.5  PLT 197 161 158   Basic Metabolic Panel: Recent Labs  Lab 09/02/22 2100 09/03/22 0009 09/03/22 0617 09/04/22 0534  NA 132*  --  133* 136  K 2.6* 3.1* 3.0* 3.3*  CL 99  --  103 106  CO2 23  --   22 23  GLUCOSE 163*  --  120* 104*  BUN 26*  --  22 17  CREATININE 1.12  --  0.87 0.68  CALCIUM 8.8*  --  8.1* 8.1*  MG  --   --  2.1  --    Liver Function Tests: Recent Labs  Lab 09/02/22 2100  AST 26  ALT 21  ALKPHOS 58  BILITOT 1.2  PROT 7.2  ALBUMIN 4.2   CBG: No results for input(s): "GLUCAP" in the last 168 hours.  Discharge time spent: greater than 30 minutes.  Signed: Delfino Lovett, MD [facility] 09/05/2022

## 2022-09-07 LAB — CULTURE, BLOOD (ROUTINE X 2)

## 2022-09-08 ENCOUNTER — Ambulatory Visit: Admission: RE | Admit: 2022-09-08 | Payer: Medicare Other | Source: Home / Self Care

## 2022-09-08 ENCOUNTER — Encounter: Admission: RE | Payer: Self-pay | Source: Home / Self Care

## 2022-09-08 SURGERY — COLONOSCOPY WITH PROPOFOL
Anesthesia: General

## 2023-05-11 ENCOUNTER — Ambulatory Visit: Payer: Medicare Other

## 2023-05-11 DIAGNOSIS — Z860101 Personal history of adenomatous and serrated colon polyps: Secondary | ICD-10-CM | POA: Diagnosis not present

## 2023-05-11 DIAGNOSIS — K640 First degree hemorrhoids: Secondary | ICD-10-CM | POA: Diagnosis not present

## 2023-05-11 DIAGNOSIS — Z09 Encounter for follow-up examination after completed treatment for conditions other than malignant neoplasm: Secondary | ICD-10-CM | POA: Diagnosis present

## 2023-05-11 DIAGNOSIS — K5730 Diverticulosis of large intestine without perforation or abscess without bleeding: Secondary | ICD-10-CM | POA: Diagnosis not present

## 2023-05-11 DIAGNOSIS — D122 Benign neoplasm of ascending colon: Secondary | ICD-10-CM | POA: Diagnosis not present

## 2023-05-11 DIAGNOSIS — D123 Benign neoplasm of transverse colon: Secondary | ICD-10-CM | POA: Diagnosis not present

## 2024-04-12 ENCOUNTER — Ambulatory Visit: Admitting: Urology
